# Patient Record
Sex: Female | Born: 1970
Health system: Southern US, Community
[De-identification: ages and names within clinical notes are randomized; demographics above are authoritative.]

## PROBLEM LIST (undated history)

## (undated) DIAGNOSIS — G43909 Migraine, unspecified, not intractable, without status migrainosus: Secondary | ICD-10-CM

## (undated) DIAGNOSIS — F329 Major depressive disorder, single episode, unspecified: Secondary | ICD-10-CM

## (undated) DIAGNOSIS — M1990 Unspecified osteoarthritis, unspecified site: Secondary | ICD-10-CM

## (undated) DIAGNOSIS — N209 Urinary calculus, unspecified: Secondary | ICD-10-CM

## (undated) DIAGNOSIS — Z90710 Acquired absence of both cervix and uterus: Secondary | ICD-10-CM

## (undated) DIAGNOSIS — N189 Chronic kidney disease, unspecified: Secondary | ICD-10-CM

## (undated) DIAGNOSIS — K589 Irritable bowel syndrome without diarrhea: Secondary | ICD-10-CM

## (undated) DIAGNOSIS — M519 Unspecified thoracic, thoracolumbar and lumbosacral intervertebral disc disorder: Secondary | ICD-10-CM

## (undated) DIAGNOSIS — G4730 Sleep apnea, unspecified: Secondary | ICD-10-CM

## (undated) DIAGNOSIS — D126 Benign neoplasm of colon, unspecified: Secondary | ICD-10-CM

## (undated) DIAGNOSIS — F32A Depression, unspecified: Secondary | ICD-10-CM

## (undated) DIAGNOSIS — G4733 Obstructive sleep apnea (adult) (pediatric): Secondary | ICD-10-CM

## (undated) DIAGNOSIS — K5790 Diverticulosis of intestine, part unspecified, without perforation or abscess without bleeding: Secondary | ICD-10-CM

## (undated) DIAGNOSIS — E079 Disorder of thyroid, unspecified: Secondary | ICD-10-CM

## (undated) DIAGNOSIS — I10 Essential (primary) hypertension: Secondary | ICD-10-CM

## (undated) DIAGNOSIS — F419 Anxiety disorder, unspecified: Secondary | ICD-10-CM

## (undated) HISTORY — DX: Benign neoplasm of colon, unspecified: D12.6

## (undated) HISTORY — PX: PARTIAL HYSTERECTOMY: SHX80

## (undated) HISTORY — DX: Sleep apnea, unspecified: G47.30

## (undated) HISTORY — DX: Unspecified osteoarthritis, unspecified site: M19.90

## (undated) HISTORY — PX: BACK SURGERY: SHX140

## (undated) HISTORY — PX: POLYPECTOMY: SHX149

## (undated) HISTORY — DX: Depression, unspecified: F32.A

## (undated) HISTORY — DX: Essential (primary) hypertension: I10

## (undated) HISTORY — DX: Disorder of thyroid, unspecified: E07.9

## (undated) HISTORY — DX: Diverticulosis of intestine, part unspecified, without perforation or abscess without bleeding: K57.90

## (undated) HISTORY — PX: LITHOTRIPSY: SUR834

## (undated) HISTORY — DX: Acquired absence of both cervix and uterus: Z90.710

## (undated) HISTORY — PX: COLONOSCOPY: SHX174

## (undated) HISTORY — DX: Urinary calculus, unspecified: N20.9

## (undated) HISTORY — DX: Unspecified thoracic, thoracolumbar and lumbosacral intervertebral disc disorder: M51.9

## (undated) HISTORY — DX: Chronic kidney disease, unspecified: N18.9

## (undated) HISTORY — DX: Anxiety disorder, unspecified: F41.9

## (undated) HISTORY — DX: Obstructive sleep apnea (adult) (pediatric): G47.33

---
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
## 1999-05-15 ENCOUNTER — Ambulatory Visit: Admission: RE | Admit: 1999-05-15 | Discharge: 1999-05-15 | Payer: Self-pay | Admitting: Family Medicine

## 2000-07-22 ENCOUNTER — Inpatient Hospital Stay (HOSPITAL_COMMUNITY): Admission: EM | Admit: 2000-07-22 | Discharge: 2000-07-26 | Payer: Self-pay | Admitting: Psychiatry

## 2001-09-19 ENCOUNTER — Other Ambulatory Visit: Admission: RE | Admit: 2001-09-19 | Discharge: 2001-09-19 | Payer: Self-pay | Admitting: Family Medicine

## 2003-05-19 ENCOUNTER — Encounter: Admission: RE | Admit: 2003-05-19 | Discharge: 2003-05-19 | Payer: Self-pay | Admitting: Family Medicine

## 2003-05-19 ENCOUNTER — Encounter: Payer: Self-pay | Admitting: Family Medicine

## 2003-12-30 ENCOUNTER — Ambulatory Visit (HOSPITAL_COMMUNITY): Admission: RE | Admit: 2003-12-30 | Discharge: 2003-12-30 | Payer: Self-pay | Admitting: Urology

## 2005-07-27 ENCOUNTER — Ambulatory Visit: Payer: Self-pay | Admitting: Internal Medicine

## 2006-06-04 ENCOUNTER — Encounter: Admission: RE | Admit: 2006-06-04 | Discharge: 2006-06-04 | Payer: Self-pay | Admitting: Family Medicine

## 2006-06-28 ENCOUNTER — Ambulatory Visit: Payer: Self-pay

## 2006-06-28 ENCOUNTER — Ambulatory Visit: Payer: Self-pay | Admitting: Family Medicine

## 2006-07-08 ENCOUNTER — Ambulatory Visit: Payer: Self-pay | Admitting: Family Medicine

## 2006-07-11 ENCOUNTER — Inpatient Hospital Stay (HOSPITAL_COMMUNITY): Admission: RE | Admit: 2006-07-11 | Discharge: 2006-07-13 | Payer: Self-pay | Admitting: Obstetrics & Gynecology

## 2006-07-11 ENCOUNTER — Encounter (INDEPENDENT_AMBULATORY_CARE_PROVIDER_SITE_OTHER): Payer: Self-pay | Admitting: *Deleted

## 2006-09-06 ENCOUNTER — Ambulatory Visit: Payer: Self-pay | Admitting: Family Medicine

## 2006-09-26 ENCOUNTER — Ambulatory Visit: Payer: Self-pay | Admitting: Family Medicine

## 2006-11-13 ENCOUNTER — Ambulatory Visit: Payer: Self-pay | Admitting: Family Medicine

## 2006-12-04 ENCOUNTER — Ambulatory Visit: Payer: Self-pay | Admitting: Family Medicine

## 2007-01-01 ENCOUNTER — Ambulatory Visit: Payer: Self-pay | Admitting: Family Medicine

## 2007-01-01 LAB — CONVERTED CEMR LAB
CO2: 25 meq/L (ref 19–32)
Calcium: 9 mg/dL (ref 8.4–10.5)
Chloride: 104 meq/L (ref 96–112)
Chol/HDL Ratio, serum: 2.8
Cholesterol: 149 mg/dL (ref 0–200)
Creatinine, Ser: 0.7 mg/dL (ref 0.4–1.2)
Glomerular Filtration Rate, Af Am: 122 mL/min/{1.73_m2}
Glucose, Bld: 80 mg/dL (ref 70–99)

## 2007-01-07 DIAGNOSIS — I10 Essential (primary) hypertension: Secondary | ICD-10-CM | POA: Insufficient documentation

## 2007-02-20 ENCOUNTER — Ambulatory Visit: Payer: Self-pay | Admitting: Family Medicine

## 2007-06-19 ENCOUNTER — Ambulatory Visit: Payer: Self-pay | Admitting: Family Medicine

## 2007-06-19 DIAGNOSIS — G4700 Insomnia, unspecified: Secondary | ICD-10-CM

## 2007-07-17 ENCOUNTER — Ambulatory Visit: Payer: Self-pay | Admitting: Family Medicine

## 2007-07-17 LAB — CONVERTED CEMR LAB: Beta hcg, urine, semiquantitative: NEGATIVE

## 2007-11-03 ENCOUNTER — Ambulatory Visit: Payer: Self-pay | Admitting: Family Medicine

## 2007-11-14 ENCOUNTER — Telehealth (INDEPENDENT_AMBULATORY_CARE_PROVIDER_SITE_OTHER): Payer: Self-pay | Admitting: Family Medicine

## 2007-12-30 ENCOUNTER — Telehealth (INDEPENDENT_AMBULATORY_CARE_PROVIDER_SITE_OTHER): Payer: Self-pay | Admitting: *Deleted

## 2007-12-31 ENCOUNTER — Ambulatory Visit: Payer: Self-pay | Admitting: Family Medicine

## 2008-01-26 ENCOUNTER — Telehealth (INDEPENDENT_AMBULATORY_CARE_PROVIDER_SITE_OTHER): Payer: Self-pay | Admitting: *Deleted

## 2008-01-26 ENCOUNTER — Ambulatory Visit: Payer: Self-pay | Admitting: Family Medicine

## 2008-03-30 ENCOUNTER — Ambulatory Visit: Payer: Self-pay | Admitting: Internal Medicine

## 2008-03-30 DIAGNOSIS — J069 Acute upper respiratory infection, unspecified: Secondary | ICD-10-CM | POA: Insufficient documentation

## 2008-05-26 ENCOUNTER — Encounter: Admission: RE | Admit: 2008-05-26 | Discharge: 2008-07-13 | Payer: Self-pay | Admitting: Obstetrics & Gynecology

## 2008-06-30 ENCOUNTER — Inpatient Hospital Stay (HOSPITAL_COMMUNITY): Admission: AD | Admit: 2008-06-30 | Discharge: 2008-06-30 | Payer: Self-pay | Admitting: Obstetrics and Gynecology

## 2008-07-06 ENCOUNTER — Inpatient Hospital Stay (HOSPITAL_COMMUNITY): Admission: AD | Admit: 2008-07-06 | Discharge: 2008-07-06 | Payer: Self-pay | Admitting: Obstetrics and Gynecology

## 2008-08-25 ENCOUNTER — Inpatient Hospital Stay (HOSPITAL_COMMUNITY): Admission: RE | Admit: 2008-08-25 | Discharge: 2008-08-28 | Payer: Self-pay | Admitting: Obstetrics & Gynecology

## 2008-09-29 ENCOUNTER — Ambulatory Visit: Payer: Self-pay | Admitting: Internal Medicine

## 2008-09-29 DIAGNOSIS — M5416 Radiculopathy, lumbar region: Secondary | ICD-10-CM

## 2008-09-29 DIAGNOSIS — M5430 Sciatica, unspecified side: Secondary | ICD-10-CM | POA: Insufficient documentation

## 2008-09-30 ENCOUNTER — Encounter: Payer: Self-pay | Admitting: Internal Medicine

## 2008-10-05 ENCOUNTER — Telehealth: Payer: Self-pay | Admitting: Internal Medicine

## 2008-10-10 ENCOUNTER — Encounter: Admission: RE | Admit: 2008-10-10 | Discharge: 2008-10-10 | Payer: Self-pay | Admitting: Internal Medicine

## 2008-10-11 ENCOUNTER — Encounter: Payer: Self-pay | Admitting: Internal Medicine

## 2008-10-13 ENCOUNTER — Telehealth: Payer: Self-pay | Admitting: Internal Medicine

## 2008-10-18 LAB — CONVERTED CEMR LAB: Pap Smear: NORMAL

## 2008-10-19 ENCOUNTER — Encounter: Payer: Self-pay | Admitting: Internal Medicine

## 2008-10-25 ENCOUNTER — Telehealth: Payer: Self-pay | Admitting: Internal Medicine

## 2008-11-23 ENCOUNTER — Ambulatory Visit (HOSPITAL_COMMUNITY): Admission: RE | Admit: 2008-11-23 | Discharge: 2008-11-23 | Payer: Self-pay | Admitting: Neurosurgery

## 2008-11-23 HISTORY — PX: LUMBAR DISC SURGERY: SHX700

## 2009-02-11 ENCOUNTER — Ambulatory Visit: Payer: Self-pay | Admitting: Internal Medicine

## 2009-02-11 DIAGNOSIS — J019 Acute sinusitis, unspecified: Secondary | ICD-10-CM

## 2009-02-11 DIAGNOSIS — G4733 Obstructive sleep apnea (adult) (pediatric): Secondary | ICD-10-CM

## 2009-02-26 ENCOUNTER — Encounter (INDEPENDENT_AMBULATORY_CARE_PROVIDER_SITE_OTHER): Payer: Self-pay | Admitting: Obstetrics & Gynecology

## 2009-02-26 ENCOUNTER — Ambulatory Visit (HOSPITAL_COMMUNITY): Admission: RE | Admit: 2009-02-26 | Discharge: 2009-02-26 | Payer: Self-pay | Admitting: Obstetrics & Gynecology

## 2009-03-16 ENCOUNTER — Ambulatory Visit: Payer: Self-pay | Admitting: Internal Medicine

## 2009-03-16 DIAGNOSIS — R635 Abnormal weight gain: Secondary | ICD-10-CM | POA: Insufficient documentation

## 2009-03-16 LAB — CONVERTED CEMR LAB
ALT: 24 units/L (ref 0–35)
Albumin: 3.8 g/dL (ref 3.5–5.2)
Alkaline Phosphatase: 54 units/L (ref 39–117)
Basophils Relative: 0.6 % (ref 0.0–3.0)
Bilirubin Urine: NEGATIVE
Bilirubin, Direct: 0.1 mg/dL (ref 0.0–0.3)
CO2: 27 meq/L (ref 19–32)
Chloride: 106 meq/L (ref 96–112)
Eosinophils Absolute: 0.1 10*3/uL (ref 0.0–0.7)
Eosinophils Relative: 1.6 % (ref 0.0–5.0)
Hemoglobin: 12.5 g/dL (ref 12.0–15.0)
Hgb A1c MFr Bld: 4.8 % (ref 4.6–6.5)
Leukocytes, UA: NEGATIVE
MCHC: 34.7 g/dL (ref 30.0–36.0)
MCV: 91.5 fL (ref 78.0–100.0)
Monocytes Absolute: 0.5 10*3/uL (ref 0.1–1.0)
Neutro Abs: 3.5 10*3/uL (ref 1.4–7.7)
Nitrite: NEGATIVE
Potassium: 3.7 meq/L (ref 3.5–5.1)
RBC: 3.94 M/uL (ref 3.87–5.11)
Sodium: 139 meq/L (ref 135–145)
Specific Gravity, Urine: >=1.03 (ref 1.000–1.030)
T3, Free: 3 pg/mL (ref 2.3–4.2)
T4, Total: 9 ug/dL (ref 5.0–12.5)
Total Protein: 6.7 g/dL (ref 6.0–8.3)
WBC: 5.6 10*3/uL (ref 4.5–10.5)
pH: 6 (ref 5.0–8.0)

## 2009-03-17 ENCOUNTER — Encounter: Payer: Self-pay | Admitting: Internal Medicine

## 2009-04-27 ENCOUNTER — Encounter: Admission: RE | Admit: 2009-04-27 | Discharge: 2009-07-05 | Payer: Self-pay | Admitting: Neurosurgery

## 2011-03-09 ENCOUNTER — Institutional Professional Consult (permissible substitution): Payer: Self-pay | Admitting: Internal Medicine

## 2011-04-24 ENCOUNTER — Encounter: Payer: Self-pay | Admitting: Internal Medicine

## 2011-04-26 ENCOUNTER — Institutional Professional Consult (permissible substitution): Payer: Self-pay | Admitting: Internal Medicine

## 2011-05-08 NOTE — Op Note (Signed)
NAMELEYLANY, NORED             ACCOUNT NO.:  [id number]   MEDICAL RECORD NO.:  [id number]          PATIENT TYPE:  AMB   LOCATION:  SDC                           FACILITY:  [facility]   PHYSICIAN:  Freddy Finner, M.D.   DATE OF BIRTH:  Sep 23, 1971   DATE OF PROCEDURE:  02/26/2009  DATE OF DISCHARGE:                               OPERATIVE REPORT   PREOPERATIVE DIAGNOSES:  1. Dysfunctional uterine bleeding.  2. Ultrasound finding of a large endometrial polyp.   POSTOPERATIVE DIAGNOSES:  1. Dysfunctional uterine bleeding.  2. Ultrasound finding of a large endometrial polyp.   OPERATIVE PROCEDURES:  1. Dilation and curettage.  2. Hysteroscopy with resection of polyp.   SURGEON:  Freddy Finner, MD   ANESTHESIA:  General.   INTRAOPERATIVE COMPLICATIONS:  None.   ESTIMATED INTRAOPERATIVE BLOOD LOSS:  50 mL.   SORBITOL DEFICIT:  65 mL.   The patient is a 40 year old who has had a prolonged period going on for  approximately 2 weeks and in the very recent past has had marked  increase in flow with large clots and using several tampons daily.  She  had an ultrasound in the office on the day before surgery showing what  appeared to be a 3 cm endometrial polyp.  She was admitted at this time  for a dilation and curettage hysteroscopy.  She was admitted on the  morning of surgery.  She was given a bolus of Ancef IV.  She was taken  to the operating room, placed under adequate general anesthesia, placed  in the dorsal lithotomy position.  Allen stirrups were used.  Betadine  prep was carried out in the usual fashion.  Sterile drapes were applied.  A bivalve speculum was introduced.  Cervix was visualized, grasped with  a single-tooth tenaculum.  Cervix easily dilated to 27 with Pratts.  A  12.5 degree hysteroscope was introduced.  Photographs revealed what  appeared to be at least 2 polyps, although the large 3 cm polyp was not  felt to be that large.  Thorough curettage was carried out  with a sharp  curet and exploration with polyp forceps.  Re-inspection revealed  adequate sampling of the entire endometrium and resection of the polyp.  Procedure was terminated, the instruments were  removed.  The patient was awakened, taken to recovery in good condition.  She will be given Vicodin to be taken as needed for postoperative pain.  She was given Toradol 30 mg IV.  Before leaving the operating room, she  will be given routine outpatient surgical instructions.  She is for  follow-up in 1 week.      Freddy Finner, M.D.  Electronically Signed     WRN/MEDQ  D:  02/27/2009  T:  02/27/2009  Job:  [id number]

## 2011-05-08 NOTE — Op Note (Signed)
NAMEDANN, Monique Gross             ACCOUNT NO.:  [id number]   MEDICAL RECORD NO.:  [id number]          PATIENT TYPE:  OIB   LOCATION:  3524                         FACILITY:  [facility]   PHYSICIAN:  Danae Orleans. Venetia Maxon, M.D.  DATE OF BIRTH:  [birth year]   DATE OF PROCEDURE:  11/23/2008  DATE OF DISCHARGE:  11/23/2008                               OPERATIVE REPORT   PREOPERATIVE DIAGNOSES:  Left L5-S1 herniated lumbar disk with  spondylosis, degenerative disk disease, and radiculopathy.   POSTOPERATIVE DIAGNOSES:  Left L5-S1 herniated lumbar disk with  spondylosis, degenerative disk disease, and radiculopathy.   PROCEDURE:  Left L5-S1 microdiskectomy with microdissection.   SURGEON:  Danae Orleans. Venetia Maxon, MD   ASSISTANT:  Hewitt Shorts, MD   ANESTHESIA:  General endotracheal anesthesia.   ESTIMATED BLOOD LOSS:  Minimal (less than 25 mL).   COMPLICATIONS:  None.   DISPOSITION:  Recovery.   INDICATIONS:  Monique Gross is 40 year old morbidly obese postpartum  woman with a severe left lumbar radiculopathy.  She had an MRI, which  shows a large fragment of herniated disk material causing significant  nerve root compression.  It was elected to take her for surgery for  microdiskectomy.   PROCEDURE:  Monique Gross was brought to the operating room.  Following  satisfactory and uncomplicated induction of general endotracheal  anesthesia and placement of intravenous lines, the patient was placed in  a prone position on the Wilson frame.  Her low back was then prepped and  draped in the usual sterile fashion.  A spinal needle was used to  localize the planned skin incision and demonstrated over the superior  aspect of the sacrum.  Incision was made after infiltrating skin and  subcutaneous tissues with local lidocaine and carried to the lumbodorsal  fascia, which was incised on the left side of midline using 10 blade.  The patient had approximately 6 inches of adipose tissue above the  lumbodorsal fascia.  The subperiosteal dissection was then performed  exposing the L5-S1 interspace and intraoperative x-ray confirmed correct  level.  Hemisemilaminectomy of L5 was performed and foraminotomy was  performed overlying the superior aspect of the sacrum.  Microscope was  brought into the field and after mobilizing and removing the ligamentum  flavum, the herniated disk material was identified and this was found to  be a disk herniation within the axilla of the S1 nerve root and the S1  nerve root was pushed laterally and with under considerable pressure.  The herniated initial removal of disk material was performed from within  the axilla of the nerve root and subsequently this allowed [place] to  mobilize the S1 nerve root medially.  The disk space was then further  evacuated of residual disk material both laterally and medially as well  as cephalad herniated disk material was removed and caudally migrated  fragments of disk material.  At minimal curettage, the endplates were  performed to remove loose remaining disk material.  At this point, it  was felt that the nerve roots were well decompressed and the S1 nerve  root was now mobilized and it  did not appear to be under any pressure.  Wound was irrigated.  Hemostasis was assured.  The operative site was  bathed in Depo-Medrol and fentanyl and the lumbodorsal fascia was closed  with 0 Vicryl sutures.  Subcutaneous tissues were approximated with 2-0  Vicryl interrupted inverted sutures and skin edges were approximated  with interrupted 3-0 Vicryl subcuticular  stitch.  Wound was dressed  with Dermabond.  The patient was x-rayed in the operating room, taken to  the recovery in stable and satisfactory condition.  She tolerated the  operation well.  Counts were correct at the end of the case.      Danae Orleans. Venetia Maxon, M.D.  Electronically Signed     JDS/MEDQ  D:  11/23/2008  T:  11/23/2008  Job:  [id number]

## 2011-05-08 NOTE — Discharge Summary (Signed)
Monique Gross, Monique Gross             ACCOUNT NO.:  [id number]   MEDICAL RECORD NO.:  [id number]          PATIENT TYPE:  INP   LOCATION:  9312                          FACILITY:  [facility]   PHYSICIAN:  Guy Sandifer. Henderson Cloud, M.D. DATE OF BIRTH:  [birth year]   DATE OF ADMISSION:  08/25/2008  DATE OF DISCHARGE:  08/28/2008                               DISCHARGE SUMMARY   ADMITTING DIAGNOSES:  1. Intrauterine pregnancy at 38-6/7th weeks.  2. Surgically scarred uterus.   DISCHARGE DIAGNOSES:  1. Intrauterine pregnancy at 38-6/7th weeks.  2. Surgically scarred uterus.   PROCEDURE:  On August 25, 2008, low transverse cesarean section.   REASON FOR ADMISSION:  This patient is a 40 year old married white  female G3, P0 with a previous myomectomy.  She is admitted for primary  cesarean section.   HOSPITAL COURSE:  The patient is admitted to the hospital and undergoes  the above procedure.  It is productive of a viable baby, Apgars of 9 at  9 minutes and 1 at 5 minutes respectively.  Birth weight, 10 pounds 8  ounces.  Arterial cord pH 7.31.  On the first postoperative day, vital  signs are stable.  She is afebrile.  Hemoglobin is 9.9 and platelets are  132,000.  Some blood pressures are labile as high as 161/82, however of  the last 24 hours prior to admission, blood pressures are 137-140/87-90.  Repeat laboratories on the day of discharge reveal normal values for  pregnancy with platelets of 157,000.  She does have a lower extremity  edema.  Incision is healing well.   CONDITION ON DISCHARGE:  Good.   DIET:  Regular as tolerated.   ACTIVITY:  No lifting, no operation of automobiles, and no vaginal  entry.  She is to call the office for problems including but not limited  to temperature of 101 degrees, persistent nausea or vomiting, increasing  pain, or heavy vaginal bleeding.   MEDICATIONS:  1. Percocet 5/325 mg, #40 1-2 p.o. q.6 h. p.r.n.  2. Ibuprofen 600 mg q.6 h. p.r.n.  3. Prenatal  vitamin daily.  4. Follow up is in the office in 4-5 days for staple removal and blood      pressure check.      Guy Sandifer Henderson Cloud, M.D.  Electronically Signed     JET/MEDQ  D:  08/28/2008  T:  08/28/2008  Job:  [id number]

## 2011-05-08 NOTE — Op Note (Signed)
NAMESHAWNIE, NICOLE NO.:  [id number]   MEDICAL RECORD NO.:  [id number]          PATIENT TYPE:  INP   LOCATION:  9121                          FACILITY:  [facility]   PHYSICIAN:  Freddy Finner, M.D.   DATE OF BIRTH:  1971-02-18   DATE OF PROCEDURE:  08/25/2008  DATE OF DISCHARGE:                               OPERATIVE REPORT   PREOPERATIVE DIAGNOSES:  1. Intrauterine pregnancy at term.  2. Surgically scarred uterus by previous deep myomectomy.   POSTOPERATIVE DIAGNOSES:  1. Intrauterine pregnancy at term.  2. Surgically scarred uterus by previous deep myomectomy.   OPERATIVE PROCEDURE:  Primary low transverse cervical cesarean section  with delivery of viable female infant, Apgars of 9 and 9, arterial cord  pH 7.31, birth weight 10 pounds 8 ounces.   INTRAOPERATIVE COMPLICATIONS:  None.   ANESTHESIA:  Spinal.   ESTIMATED INTRAOPERATIVE BLOOD LOSS:  800 mL.   SURGEON:  Freddy Finner, M.D.   ASSISTANT:  Stann Mainland. Vincente Poli, M.D.   The patient was admitted on the morning of surgery.  She was brought to  the operating room.  She received a gram of Ancef IV.  She was placed  under adequate spinal anesthesia, placed in dorsal recumbent position.  Abdomen was prepped and draped in the usual fashion.  The abdominal  panniculus was taped to EVOscreen for exposure during the procedure.  Foley catheter was placed using sterile technique.  A lower abdominal  transverse incision was made approximately 2 cm above the symphysis and  carried sharply down to fascia.  Fascia was entered sharply and extended  to the external skin incision.  Rectus sheath was developed superiorly  and inferiorly with blunt and sharp dissection.  Rectus muscles were  divide in the midline.  Peritoneum was entered sharply and extended  bluntly to the external skin incision.  Bladder blade was placed.  Transverse incision was made in the visceral peritoneum overlying the  lower uterine segment.   Bladder was dissected off the lower segment.  Transverse incision was made in the lower uterine segment with a scalpel  and extended bluntly.  Amniotomy was performed.  Fluid was clear.  A  vacuum extractor was then used to deliver a viable female infant without  any significant complications, statistics are noted above.  Cord blood  was obtained for an arterial sample.  The placenta was submitted for  cord blood donation.  The uterine cavity was confirmed to be clear by  careful manual exploration and removal of all parts of conception.  Uterus was delivered onto the anterior abdominal wall.  Tubes and  ovaries were normal.  There were no adhesions but there was a defect in  the superior fundus in the midline consistent with a previous incision.  Uterine incision was closed in a double layer, running locking 0  Monocryl was used for the first and an imbricating suture of 0 Monocryl  for the second.  A figure-of-eight was required on the right side of the  incision for complete hemostasis.  Uterus was delivered back into the  abdominal cavity.  Irrigation  was carried out.  Hemostasis was complete.  All pack, needle and instrument counts were correct.  Abdominal incision  was then closed in layers.  Running 0 Monocryl was used to close  peritoneum and reapproximate the rectus muscles.  Fascia was closed with  a looped 0 PDS running from angle to angle on either side.  Subcutaneous  tissue was approximated with 2-0 plain.  Skin was closed with wide skin  staples.  The patient tolerated the operative procedure well.  She was  taken to the recovery room in good condition.      Freddy Finner, M.D.  Electronically Signed     WRN/MEDQ  D:  08/25/2008  T:  08/25/2008  Job:  [id number]

## 2011-05-08 NOTE — H&P (Signed)
NAMECHLOEANNE, POTEET NO.:  [id number]   MEDICAL RECORD NO.:  [id number]          PATIENT TYPE:  INP   LOCATION:                                F[facility]   PHYSICIAN:  Freddy Finner, M.D.   DATE OF BIRTH:  [birth year]   DATE OF ADMISSION:  08/25/2008  DATE OF DISCHARGE:                              HISTORY & PHYSICAL   ADMISSION DIAGNOSIS:  Intrauterine pregnancy at 38-6/[redacted] weeks gestation.  Surgically scarred uterus from previous date myomectomy for cesarean  delivery.   The patient is a 40 year old white married female, gravida 3 para 0, who  had a previous myomectomy for a large submucous myoma requiring deep  incision through the fundus.  She is now admitted for cesarean delivery.  Her prenatal course has been remarkable for mild light increase in blood  pressure with a probable mild chronic hypertensive component.  She was  hospitalized during the pregnancy once for suspected kidney stones,  which were not documented, but an incidental gallstone was noted at that  time measuring approximately 2.2 cm.  Her strep status is known to be  negative.  Her pregnancy has otherwise been further remarkable for lower  back pain requiring physical therapy and rehab.  There have been no  other prenatal complications.   CURRENT REVIEW OF SYSTEMS:  No cardiac, pulmonary or GI symptoms or GU  symptoms.   PAST MEDICAL HISTORY:  Detailed in the prenatal summary and will not be  repeated.   PHYSICAL EXAMINATION:  HEENT:  Grossly within normal limits.  The  patient is alert and oriented and cooperative.  NECK:  Her thyroid gland is not palpably enlarged to my examination.  VITAL SIGNS:  Blood pressure in the office on the day before admission  was 144/92 with a trace of proteinuria.  CHEST:  Clear to auscultation throughout.  HEART:  Normal sinus rhythm without murmurs, rubs or gallops.  ABDOMEN:  Gravid.  Fundal height is 42 cm.  Estimated fetal weight is 9   pounds.  EXTREMITIES:  There is +2 edema.  Deep tendon reflexes 1+.   ASSESSMENT:  Intrauterine pregnancy at 38-6/[redacted] weeks gestation.  Deeply  scarred uterine fundus.   PLAN:  Cesarean delivery.      Freddy Finner, M.D.  Electronically Signed     WRN/MEDQ  D:  08/24/2008  T:  08/24/2008  Job:  [id number]
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
## 2011-05-11 NOTE — Discharge Summary (Signed)
NAMEMarland Kitchen  Monique Gross, Monique Gross            ACCOUNT NO.:  [id number]   MEDICAL RECORD NO.:  [id number]          PATIENT TYPE:  INP   LOCATION:  9308                          FACILITY:  [facility]   PHYSICIAN:  Freddy Finner, M.D.   DATE OF BIRTH:  05-Sep-1971   DATE OF ADMISSION:  07/11/2006  DATE OF DISCHARGE:  07/13/2006                                 DISCHARGE SUMMARY   DISCHARGE DIAGNOSIS:  Submucous myoma.   SECONDARY DIAGNOSIS:  Hypertension.   PROCEDURE:  Hysteroscopy followed laparotomy with myomectomy.   INTRAOPERATIVE AND POSTOPERATIVE COMPLICATIONS:  None.   DISPOSITION:  The patient is in satisfactory, improved condition at the time  of her discharge.  She is to resume all of her preoperative medications  including:   1.  Two medications for hypertension, Benicar and hydrochlorothiazide.  2.  She is to use MiraLax as per her custom.  3.  She is to use Colace as needed.  4.  She is given Percocet 5/325 mg to be taken one to two every 4-6 hours as      needed for postoperative pain.  5.  She is to add over-the-counter ibuprofen to this regimen as needed.  6.  She is given Zofran oral dissolving tablets to be used as needed for      nausea.   Details of the present illness, past history, family history, review of  systems and physical exam recorded in the admission note.  Her physical exam  was primarily remarkable for enlargement of the uterus and for  sonohysterogram findings of a very large submucous myoma.   Laboratory data during this admission includes a normal prothrombin time and  PTT on admission.  Hemoglobin of 10.5 on admission.  Essentially normal Chem-  7 on admission.  Postoperative hemoglobin was 9.2.  pathologic examination  of the myoma is pending at the time of discharge.   The patient was admitted on the morning of surgery.  Hysteroscopy was  performed with __________ being able to accomplish a submucous myomectomy  hysteroscopically.  Visibility was  significantly compromised because of the  presence of bleeding and large size of the mass.  For that reason we elected  to proceed with laparotomy and open myomectomy.  This was accomplished  without difficulty.  The patient's postoperative course was without  complication.  She remained afebrile throughout her postoperative stay.  By  the morning of the second postoperative day she was ambulating without  difficulty.  She was having adequate bowel and bladder function.  She was  tolerating a regular diet.  Further disposition as noted above.      Freddy Finner, M.D.  Electronically Signed     WRN/MEDQ  D:  07/13/2006  T:  07/13/2006  Job:  [id number]

## 2011-05-11 NOTE — Op Note (Signed)
NAMEMarland Gross  TAHARA, RUFFINI            ACCOUNT NO.:  [id number]   MEDICAL RECORD NO.:  [id number]          PATIENT TYPE:  INP   LOCATION:  9308                          FACILITY:  [facility]   PHYSICIAN:  Freddy Finner, M.D.   DATE OF BIRTH:  20-Jan-1971   DATE OF PROCEDURE:  DATE OF DISCHARGE:                                 OPERATIVE REPORT   PREOPERATIVE DIAGNOSIS:  Submucous myoma.   OPERATIVE PROCEDURE:  Laparoscopy followed by laparotomy and open  myomectomy.   SURGEON:  Neal.   ASSISTANT:  __________   ESTIMATED OPERATIVE BLOOD LOSS:  100 cc.   SORBITOL DEFICIT FROM HYSTEROSCOPY:  100 CC.   ANESTHESIA:  General endotracheal.   INTRAOPERATIVE COMPLICATIONS:  None.   The patient's problems date back to the Fall of 2006. She had hysteroscopy  and laparoscopy at another facility with another physician.  There were  benign endometrial samples taken at that hysteroscopy but the submucous  myoma was not fully outlined or appreciated.  Apparently there were no  significant abnormal pelvic findings except for some small paravertebral  cysts that were fulgurated.  She presented to my office in June of this year  and subsequently had a sonohistogram which identified a very large submucous  myoma measuring at least 6.5 cm and in one projection 7.8 x 6.5 cm.  It  appeared to be a degenerating myoma and had some calcifications in the mass  itself.  The patient has continued to have major clinical symptoms,  menorrhagia with large clots and dysmenorrhea.  She specifically does not  want to have a hysterectomy.  She is now admitted for attempted  hysteroscopic myomectomy with the distinct understanding that it is very  likely that a laparotomy will be required.  She is brought to the operating  room under those circumstances.  She was given a bolus of cefoxitin  preoperatively.   After arriving in the operating room, she was placed under adequate general  endotracheal anesthesia, placed in  dorsolithotomy position.  Betadine prep  of perineum and vagina was carried out in usual fashion.  Sterile drapes  were applied.  The cervix was visualized  using a bivalve speculum and was  grasped with the anterior lip of single tooth tenaculum.  A paracervical  block was placed with injections at 4:00 and 9:00 in the vaginal fornices, a  total of 10 cc of 1% plain Xylocaine was utilized.  The uterus sounded to 12  cm.  The cervix was dilated to 23 with [facility] dilators.  The __________  hysteroscope was introduced using 3% Sorbitol as a distending medium.  Repeated attempts were made to clear the blood from the cavity including  polyps which were retrieved with a right-angle staying forceps.  It became  obvious that it would not be possible to see around a major portion of the  myoma.  For that reason, it was elected to proceed with a laparotomy.  The  drapes were removed, the abdomen was prepped and draped in the usual  fashion.  A Foley catheter was placed.  A low abdominal transverse incision  was  made and carried sharply down the fascia which was entered sharply and  then extended to extend the skin incision.  __________  superiorly and  inferiorly with blunt and sharp dissection.  The rectus muscle was divided  in the midline.  The peritoneum was entered and extended to extend the skin  incision.  With exploration of the upper abdomen was somewhat compromised by  the patient's body habitus with obesity, but the liver edge was palpably  normal.  No obvious palpable abnormalities were noted.  An Lenox Ahr self-retaining retractors placed.  Moist packs were used to pack  the intestinal contents out of the pelvis.  Zero-Monocryl suture was placed  in the superior fundus to elevate it out of the pelvis and a __________  wet  pack placed behind the uterus to keep it in the operative field for the  ensuing procedure.   An  incision was made in the midline across the top of the  fundus and onto  the posterior fundal surface where with careful sharp dissection a fibroid  was identified.  Using Mayo scissors and towel clips, the fibroid was  eventually completely cleared and separated and sent for pathologic  examination.  Manual exploration revealed a normal palpable endometrial  cavity below the level of the fibroid.  The muscle incision was then closed  in double layers with interrupted figure-of-eights with 0 Monocryl.  The  surface incision was closed with a running and locking 0 Monocryl.  Tubes  and ovaries were inspected and found to be normal.  All pack, needle and  instrument counts were made after irrigation was carried out.  Hemostasis  was adequate.  Intercede was placed over the incision lying in the upper  fundus.  After appropriate counts, the abdominal incision was closed in  layers.  The rectus muscle was reapproximated with a figure-of-eight of 0  Monocryl.  The fascia was closed with a running 0 PDS __________  .  Subcutaneous tissue was approximated with running 2-0 plain.  Skin was  closed with a lot of skin staples and __________  Steri-Strips.   ESTIMATED INTRAOPERATIVE BLOOD LOSS:  100 cc as noted above.   The patient was awakened and taken to the recovery room in good condition.      Freddy Finner, M.D.  Electronically Signed     WRN/MEDQ  D:  07/11/2006  T:  07/12/2006  Job:  [id number]

## 2011-05-11 NOTE — Discharge Summary (Signed)
[facility]  Patient:    Monique Gross, Monique Gross                  MRN: [id number] Adm. Date:  [id number] Disc. Date: [id number] Attending:  Gerhard Munch                           Discharge Summary  REASON FOR ADMISSION:  This is a 40 year old divorced white female who was admitted for status post several overdose attempts over the weekend and continued suicidal ideation.  ADMITTING DIAGNOSES: Axis I:    1. Major depressive disorder, recurrent, severe.            2. Alcohol abuse. Axis II:   Deferred. Axis III:  Obesity. Axis IV:   Severe. Axis V:    On admission was 40.  LABORATORY DATA:  Patient had a metabolic panel which showed no abnormalities. Her urine drug screen was positive for opiates which is related to her overdose with some narcotics.  Her urinalysis showed many bacteria but was nitrate negative.  Metabolic panel showed a slightly elevated glucose at 134, albumin low at 3.2, no other abnormalities.  CBC was within normal limits and thyroid functions were within normal limits.  HOSPITAL COURSE:  This patient was placed on Celexa 20 mg and Wellbutrin 150 p.o. q.d. on admission.  She was also started on Klonopin 0.5 mg p.o. b.i.d. p.r.n. anxiety and Trazodone 50 mg q.h.s. for insomnia.  Patient struggled with her sleep throughout her hospitalization, and we eventually gradually increased her Trazodone up to 150 mg p.o. q.h.s. which seems to be helping a little bit.  However, noticeably throughout her hospitalization initially she was quite tearful, withdrawn, sad, but she did attend groups and her affect has become brighter.  She has become more interactive, less tearful.  Also reports decreased thoughts of suicide and increased ability to control her thoughts of suicide.  Patient was dealing with a lot of issues of self esteem and not feeling attractive; however, she did realize that she has more support than she realized.  She had  many friends who came to see her and had made several friends on the unit, and this seemed to increase her self esteem significantly.  At the time of discharge, we increased her Trazodone to 150 mg p.o. q.h.s. and increased her Celexa to 40 mg.  She tolerated the medications without side effects.  Patient was able to contract for safety upon discharge and was willing to follow up with [facility] in the next week to continue outpatient therapy.  Therefore, she was discharged in stable condition.  FINAL DIAGNOSIS: Axis I:    1. Major depressive disorder, recurrent, severe.            2. Alcohol abuse. Axis II:   Deferred. Axis III:  Obesity. Axis IV:   Severe. Axis V:    On admission was 40, on discharge was 65.  DISCHARGE MEDICATIONS: 1. Klonopin 0.5 mg and she was given a scrip for #45, 1-2 p.o. q.d. 2. Celexa 40 mg, #30, one p.o. q.d. 3. Wellbutrin SR 150 mg, one p.o. q.d. 4. Trazodone 150 mg 1 p.o. q.h.s. Each of these were for a one-months supply.  ACTIVITY:  No restrictions.  DIET:  No restrictions.  FOLLOW UP:  With [facility] within the next week as per case manager and myself within the next two to three weeks. DD:  07/26/00 TD:  07/27/00 Job: [id number]

## 2011-05-11 NOTE — H&P (Signed)
NAMEMarland Gross  FLORENCE, YEUNG            ACCOUNT NO.:  [id number]   MEDICAL RECORD NO.:  [id number]          PATIENT TYPE:  INP   LOCATION:  9308                          FACILITY:  [facility]   PHYSICIAN:  Freddy Finner, M.D.   DATE OF BIRTH:  14-Mar-1971   DATE OF ADMISSION:  07/11/2006  DATE OF DISCHARGE:                                HISTORY & PHYSICAL   ADMISSION DIAGNOSIS:  Submucous myoma status post exploratory laparotomy for  resection of submucous myoma.   The patient is a 40 year old white divorced female, gravida 1, para 0, who  has had a significant bleeding issue for at least six to eight months.  Her  operative note contains a brief description of her present history which  will not be repeated here except to say that she had a sonohistogram in the  office which showed a very large submucous myoma.  She is admitted now for a  surgical resection of that lesion via hysteroscopy or laparotomy.  She has  indicated a definite wish to have the lesion removed regardless of the  procedure required.   REVIEW OF SYSTEMS:  Her current review of systems is negative except for GYN  problems, very heavy bleeding with large clots.  Postmenstrual fatigue and  weakness.   PAST MEDICAL HISTORY:  The patient has no known significant illness.  She is  a pack a day cigarette smoker.  She has previously had hysteroscopy and D&C  in the fall of 2006.  She is known to have kidney stones.  She also  apparently has narcolepsy for which she takes Provigil 200 mg a day.  She is  on no other medications on a chronic basis.  She has never had a blood  transfusion.  Does not use alcohol on a regular basis.   FAMILY HISTORY:  Noncontributory.   PHYSICAL EXAMINATION:  HEENT:  Grossly within normal limits.  VITAL SIGNS:  Blood pressure in the office 142/90.  NECK:  Thyroid gland is not palpably enlarged.  CHEST:  Clear to auscultation.  HEART:  Normal sinus rhythm without murmurs, gallops, or rubs.  ABDOMEN:  Soft and nontender without appreciable organomegaly or palpable  masses.  PELVIC:  External genitalia, vagina and cervix are normal to inspection.  Bimanual reveals the uterus to be anterior position at approximately nine  weeks gestational size.  There are no palpable adnexal masses.  RECTAL:  Rectum was normal.  Rectovaginal exam confirms the above findings.   ASSESSMENT:  Uterine leiomyomata, submucous.   PLAN:  Hysteroscopy followed by laparotomy if the mass cannot be resected  hysteroscopically.  The patient is prepared for both events.      Freddy Finner, M.D.  Electronically Signed     WRN/MEDQ  D:  07/11/2006  T:  07/11/2006  Job:  [id number]

## 2011-06-05 ENCOUNTER — Institutional Professional Consult (permissible substitution): Payer: Self-pay | Admitting: Internal Medicine

## 2011-06-24 HISTORY — PX: TUBAL LIGATION: SHX77

## 2011-09-20 LAB — CBC
Platelets: 167
RBC: 4.08
WBC: 11.4 — ABNORMAL HIGH

## 2011-09-20 LAB — URINALYSIS, ROUTINE W REFLEX MICROSCOPIC
Bilirubin Urine: NEGATIVE
Glucose, UA: NEGATIVE
Glucose, UA: NEGATIVE
Ketones, ur: >80 — AB
Nitrite: NEGATIVE
Protein, ur: NEGATIVE
Urobilinogen, UA: 0.2
pH: 6

## 2011-09-20 LAB — URINE MICROSCOPIC-ADD ON

## 2011-09-20 LAB — COMPREHENSIVE METABOLIC PANEL
ALT: 15
AST: 17
Albumin: 2.7 — ABNORMAL LOW
CO2: 22
Calcium: 9
Chloride: 103
GFR calc Af Amer: >60
GFR calc non Af Amer: >60
Sodium: 135

## 2011-09-20 LAB — URINE CULTURE

## 2011-09-25 LAB — CBC
HCT: 41.3 % (ref 36.0–46.0)
Hemoglobin: 13.9 g/dL (ref 12.0–15.0)
MCHC: 33.6 g/dL (ref 30.0–36.0)
MCV: 91 fL (ref 78.0–100.0)
Platelets: 224 10*3/uL (ref 150–400)
RDW: 13 % (ref 11.5–15.5)

## 2011-09-26 LAB — COMPREHENSIVE METABOLIC PANEL
ALT: 19
AST: 21
Albumin: 2 — ABNORMAL LOW
Alkaline Phosphatase: 89
BUN: 11
Chloride: 106
GFR calc Af Amer: >60
Potassium: 3.6
Sodium: 137
Total Bilirubin: 0.4
Total Protein: 4.7 — ABNORMAL LOW

## 2011-09-26 LAB — CBC
HCT: 29 — ABNORMAL LOW
HCT: 29.8 — ABNORMAL LOW
MCHC: 34
MCHC: 34.2
MCV: 93.1
Platelets: 132 — ABNORMAL LOW
Platelets: 157
Platelets: 162
RDW: 14.3
RDW: 14.4
RDW: 14.9
WBC: 4.7
WBC: 7

## 2011-09-26 LAB — URIC ACID: Uric Acid, Serum: 5.7

## 2011-09-26 LAB — RPR: RPR Ser Ql: NONREACTIVE

## 2011-09-28 ENCOUNTER — Ambulatory Visit (INDEPENDENT_AMBULATORY_CARE_PROVIDER_SITE_OTHER): Payer: 59 | Admitting: Family Medicine

## 2011-09-28 ENCOUNTER — Encounter: Payer: Self-pay | Admitting: Family Medicine

## 2011-09-28 DIAGNOSIS — R040 Epistaxis: Secondary | ICD-10-CM | POA: Insufficient documentation

## 2011-09-28 DIAGNOSIS — J329 Chronic sinusitis, unspecified: Secondary | ICD-10-CM

## 2011-09-28 DIAGNOSIS — B001 Herpesviral vesicular dermatitis: Secondary | ICD-10-CM | POA: Insufficient documentation

## 2011-09-28 DIAGNOSIS — B009 Herpesviral infection, unspecified: Secondary | ICD-10-CM

## 2011-09-28 MED ORDER — VALACYCLOVIR HCL 1 G PO TABS: ORAL_TABLET | ORAL | Status: DC

## 2011-09-28 MED ORDER — CLARITHROMYCIN ER 500 MG PO TB24: 1000.0000 mg | ORAL_TABLET | Freq: Every day | ORAL | Status: AC

## 2011-09-28 NOTE — Patient Instructions (Signed)
Schedule a complete physical at your convenience- do not eat before this appt Apply vasoline w/ a Qtip to the inside of your L nostril Avoid using the Flonase temporarily If you have a nose bleed- lean forward, ice to the bridge of the nose, Afrin, nasal packing (tampon).  If still bleeding after 20 minutes, call or go to the [facility] Start Clarithromycin for your sinus infection REST! Hang in there!

## 2011-09-28 NOTE — Assessment & Plan Note (Signed)
Appears to be from shallow ulceration on L anterior septum.  Pt to hold nasal spray.  Reviewed supportive care and red flags that should prompt return.  Pt expressed understanding and is in agreement w/ plan.

## 2011-09-28 NOTE — Assessment & Plan Note (Signed)
infxn has not improved on Zpack.  Switch to biaxin.  Reviewed supportive care and red flags that should prompt return.  Pt expressed understanding and is in agreement w/ plan.

## 2011-09-28 NOTE — Assessment & Plan Note (Signed)
Start Valtrex to improve sxs.

## 2011-09-28 NOTE — Progress Notes (Signed)
  Subjective:    Patient ID: Monique Gross, female    DOB: 1971-08-19, 40 y.o.   MRN: [id number]  HPI New to establish w/ me.  Nose bleeds- reports hx of frequent sinus infxns, went to [facility] Sunday night and started Zpack.  Has had 2 nose bleeds, was passing blood clots (Monday and Thursday).  Has been using Flonase recently due to infxn.  Bleeding lasted few minutes, stopped spontaneously.  No bleeding down back of throat.  Sinus pain is persisting.  Low grade temps.  Cold sore- appeared 1 week ago.  Using Abreva.  Has never used Valtrex.  No longer painful.   Review of Systems For ROS see HPI     Objective:   Physical Exam  Vitals reviewed. Constitutional: She is oriented to person, place, and time. She appears well-developed and well-nourished. No distress.  HENT:  Head: Normocephalic and atraumatic.  Mouth/Throat: Oropharynx is clear and moist. No oropharyngeal exudate.       + TTP over maxillary sinuses L nostril has small ulceration on anterior, medial septum. Large cold sore over R upper lip  Eyes: Conjunctivae and EOM are normal. Pupils are equal, round, and reactive to light.  Neck: Normal range of motion. Neck supple.  Cardiovascular: Normal rate, regular rhythm and normal heart sounds.   Pulmonary/Chest: Effort normal and breath sounds normal. No respiratory distress. She has no wheezes. She has no rales.  Musculoskeletal: She exhibits no edema.  Lymphadenopathy:    She has no cervical adenopathy.  Neurological: She is oriented to person, place, and time. No cranial nerve deficit.  Skin: Skin is warm and dry.  Psychiatric: She has a normal mood and affect. Her behavior is normal.          Assessment & Plan:

## 2011-11-01 ENCOUNTER — Encounter (INDEPENDENT_AMBULATORY_CARE_PROVIDER_SITE_OTHER): Payer: Self-pay | Admitting: General Surgery

## 2011-11-07 ENCOUNTER — Encounter: Payer: Self-pay | Admitting: Family Medicine

## 2011-11-08 ENCOUNTER — Encounter: Payer: 59 | Admitting: Family Medicine

## 2011-11-08 ENCOUNTER — Ambulatory Visit: Payer: Self-pay | Admitting: Family Medicine

## 2011-11-19 ENCOUNTER — Encounter (INDEPENDENT_AMBULATORY_CARE_PROVIDER_SITE_OTHER): Payer: Self-pay

## 2011-11-20 ENCOUNTER — Ambulatory Visit (INDEPENDENT_AMBULATORY_CARE_PROVIDER_SITE_OTHER): Payer: 59 | Admitting: General Surgery

## 2011-11-20 ENCOUNTER — Encounter (INDEPENDENT_AMBULATORY_CARE_PROVIDER_SITE_OTHER): Payer: Self-pay | Admitting: General Surgery

## 2011-11-20 ENCOUNTER — Encounter: Payer: Self-pay | Admitting: Gastroenterology

## 2011-11-20 VITALS — BP 124/78 | HR 64 | Temp 98.4°F | Resp 18 | Ht 66.0 in | Wt 266.6 lb

## 2011-11-20 DIAGNOSIS — K589 Irritable bowel syndrome without diarrhea: Secondary | ICD-10-CM

## 2011-11-20 NOTE — Progress Notes (Signed)
Patient ID: Monique Gross, female   DOB: 1971-02-01, 40 y.o.   MRN: [id number]  Chief Complaint  Patient presents with  . Follow-up    reck gallbladder... last seen 2009    HPI Monique Gross is a 40 y.o. female.   HPI  She was last seen by me October 01, 2008 for asymptomatic cholelithiasis. At that time, she had a 2 cm mobile gallstone.  Over the past 2 years, she has been having intermittent episodes of crampy lower pelvic pain and diarrhea after eating fried or greasy foods. She was concerned this may be related to her gallbladder. This happens approximately twice a month. She also has alternating constipation and has to take MiraLax every morning at times. No blood in her stool. No upper abdominal pain or dyspepsia symptoms.  Past Medical History  Diagnosis Date  . HTN (hypertension)   . Urolithiasis   . Lumbar disc disease   . OSA (obstructive sleep apnea)   . Chronic kidney disease     hx of kidney stones    Past Surgical History  Procedure Date  . Cesarean section   . Lumbar disc surgery 11-2008  . Tubal ligation 06/2011  . Lithotripsy     Family History  Problem Relation Age of Onset  . Urolithiasis Father   . Kidney disease Father     renal stones  . Thyroid disease Mother   . Thyroid disease Maternal Grandmother     Social History History  Substance Use Topics  . Smoking status: Former Games developer  . Smokeless tobacco: Not on file  . Alcohol Use: No    No Known Allergies  Current Outpatient Prescriptions  Medication Sig Dispense Refill  . HYDROcodone-acetaminophen (VICODIN) 5-500 MG per tablet Take 1 tablet by mouth every 6 (six) hours as needed.        . valACYclovir (VALTREX) 1000 MG tablet 2 tabs q12 x2 doses  4 tablet  0  . zolpidem (AMBIEN) 10 MG tablet Take 10 mg by mouth at bedtime as needed.          Review of Systems Review of Systems  Constitutional: Negative for fever and chills.  Respiratory: Negative.   Cardiovascular: Negative.     Gastrointestinal: Positive for abdominal distention.  Genitourinary: Negative for hematuria.    Blood pressure 124/78, pulse 64, temperature 98.4 F (36.9 C), temperature source Temporal, resp. rate 18, height 5\' 6"  (1.676 m), weight 266 lb 9.6 oz (120.929 kg).  Physical Exam Physical Exam  Constitutional: No distress.       Obese.  HENT:  Head: Normocephalic and atraumatic.  Eyes: Conjunctivae and EOM are normal.  Abdominal: Soft. She exhibits no distension and no mass. There is tenderness (llq). There is no rebound and no guarding.  Musculoskeletal: She exhibits no edema.       No CVA tenderness.    Data Reviewed 09/2008 note.  Assessment    Crampy lower abdominal pain and diarrhea alternating with constipation. I do not believe is related to cholelithiasis.  Her symptoms are highly suspicious for irritable bowel syndrome.    Plan      Referral to gastroenterology for further evaluation and treatment.    Khori Rosevear J 11/20/2011, 10:02 AM

## 2011-11-20 NOTE — Patient Instructions (Signed)
Low fat, high fiber diet

## 2011-11-20 NOTE — Progress Notes (Signed)
Addended by: Milas Hock on: 11/20/2011 10:13 AM   Modules accepted: Orders

## 2011-12-06 ENCOUNTER — Ambulatory Visit (INDEPENDENT_AMBULATORY_CARE_PROVIDER_SITE_OTHER): Payer: 59 | Admitting: Gastroenterology

## 2011-12-06 ENCOUNTER — Encounter: Payer: Self-pay | Admitting: Gastroenterology

## 2011-12-06 ENCOUNTER — Other Ambulatory Visit (INDEPENDENT_AMBULATORY_CARE_PROVIDER_SITE_OTHER): Payer: 59

## 2011-12-06 DIAGNOSIS — R198 Other specified symptoms and signs involving the digestive system and abdomen: Secondary | ICD-10-CM

## 2011-12-06 DIAGNOSIS — R109 Unspecified abdominal pain: Secondary | ICD-10-CM

## 2011-12-06 LAB — HEPATIC FUNCTION PANEL
ALT: 21 U/L (ref 0–35)
Albumin: 4 g/dL (ref 3.5–5.2)
Bilirubin, Direct: 0.1 mg/dL (ref 0.0–0.3)
Total Protein: 7 g/dL (ref 6.0–8.3)

## 2011-12-06 LAB — CBC WITH DIFFERENTIAL/PLATELET
Basophils Absolute: 0 10*3/uL (ref 0.0–0.1)
Basophils Relative: 0.6 % (ref 0.0–3.0)
Eosinophils Absolute: 0.1 10*3/uL (ref 0.0–0.7)
Lymphocytes Relative: 25 % (ref 12.0–46.0)
MCHC: 34.3 g/dL (ref 30.0–36.0)
Monocytes Relative: 7.1 % (ref 3.0–12.0)
Neutrophils Relative %: 66.2 % (ref 43.0–77.0)
RBC: 4.68 Mil/uL (ref 3.87–5.11)
RDW: 12.7 % (ref 11.5–14.6)

## 2011-12-06 LAB — BASIC METABOLIC PANEL
CO2: 26 mEq/L (ref 19–32)
Calcium: 9.3 mg/dL (ref 8.4–10.5)
Chloride: 106 mEq/L (ref 96–112)
Creatinine, Ser: 0.8 mg/dL (ref 0.4–1.2)
Glucose, Bld: 104 mg/dL — ABNORMAL HIGH (ref 70–99)

## 2011-12-06 MED ORDER — ALIGN 4 MG PO CAPS: 1.0000 | ORAL_CAPSULE | Freq: Every day | ORAL | Status: DC

## 2011-12-06 MED ORDER — HYOSCYAMINE SULFATE 0.125 MG SL SUBL: SUBLINGUAL_TABLET | SUBLINGUAL | Status: DC

## 2011-12-06 MED ORDER — PEG-KCL-NACL-NASULF-NA ASC-C 100 G PO SOLR: 1.0000 | Freq: Once | ORAL | Status: DC

## 2011-12-06 NOTE — Patient Instructions (Signed)
Go directly to the basement to have your labs drawn today.  You have been scheduled for a Colonoscopy with propofol. See separate instructions.  Pick up your prep kit from your pharmacy and your prescription for Levsin.  Increase your Miralax to 2-3 x daily.  Start Align samples one tablet by mouth once daily x 1 month. Samples provided to get you started and the rest can be purchased over the counter.  cc: Neena Rhymes, MD       Avel Peace, MD

## 2011-12-06 NOTE — Progress Notes (Signed)
History of Present Illness: This is a 40 year old female who evaluated previously  for chronic constipation. She underwent  colonoscopy in October 2005 that showed mild sigmoid colon diverticulosis and was otherwise normal. She was recommended to use MiraLax once or twice daily and she has maintained once a day MiraLax since that time. Over the past 3 years she has had worsening problems with constipation, going up to 5 days between bowel movements. She has had episodes of urgent diarrhea that may last 1 to 2 days at a time and occasionally has she has normal bowel movements. Her episodes of diarrhea are associated with lower abdominal cramping. She was found to have cholelithiasis and was evaluated by Dr. Abbey Chatters who felt that they were asymptomatic and her symptoms were likely  due to irritable bowel syndrome. Denies weight loss, change in stool caliber, melena, hematochezia, nausea, vomiting, dysphagia, reflux symptoms, chest pain.  Review of Systems: Pertinent positive and negative review of systems were noted in the above HPI section. All other review of systems were otherwise negative.  Current Medications, Allergies, Past Medical History, Past Surgical History, Family History and Social History were reviewed in [employer] record.  Physical Exam: General: Well developed , well nourished, obese, no acute distress Head: Normocephalic and atraumatic Eyes:  sclerae anicteric, EOMI Ears: Normal auditory acuity Mouth: No deformity or lesions Neck: Supple, no masses or thyromegaly Lungs: Clear throughout to auscultation Heart: Regular rate and rhythm; no murmurs, rubs or bruits Abdomen: Soft, non tender and non distended. No masses, hepatosplenomegaly or hernias noted. Normal Bowel sounds Rectal: No lesions, Hemoccult negative brown stool in the vault  Musculoskeletal: Symmetrical with no gross deformities  Skin: No lesions on visible extremities Pulses:  Normal pulses  noted Extremities: No clubbing, cyanosis, edema or deformities noted Neurological: Alert oriented x 4, grossly nonfocal Cervical Nodes:  No significant cervical adenopathy Inguinal Nodes: No significant inguinal adenopathy Psychological:  Alert and cooperative. Normal mood and affect  Assessment and Recommendations:  1. Presumed IBS. R/O IBD. The majority of her symptoms may be driven by her constipation. Increase daily fiber and water intake. Increase Miralax to bid or tid titrate for adequate effect. Trial of probiotics and anti-spasmodics. Bood work ordered. The risks, benefits, and alternatives to colonoscopy with possible biopsy and possible polypectomy were discussed with the patient and they consent to proceed.   2. Cholelithiasis, asymptomatic.

## 2011-12-13 ENCOUNTER — Encounter: Payer: Self-pay | Admitting: *Deleted

## 2011-12-25 DIAGNOSIS — D126 Benign neoplasm of colon, unspecified: Secondary | ICD-10-CM

## 2011-12-25 HISTORY — DX: Benign neoplasm of colon, unspecified: D12.6

## 2011-12-31 ENCOUNTER — Encounter: Payer: 59 | Admitting: Family Medicine

## 2012-01-01 ENCOUNTER — Ambulatory Visit (AMBULATORY_SURGERY_CENTER): Payer: 59 | Admitting: Gastroenterology

## 2012-01-01 ENCOUNTER — Encounter: Payer: Self-pay | Admitting: Gastroenterology

## 2012-01-01 DIAGNOSIS — D126 Benign neoplasm of colon, unspecified: Secondary | ICD-10-CM

## 2012-01-01 DIAGNOSIS — R198 Other specified symptoms and signs involving the digestive system and abdomen: Secondary | ICD-10-CM

## 2012-01-01 DIAGNOSIS — R109 Unspecified abdominal pain: Secondary | ICD-10-CM

## 2012-01-01 MED ORDER — SODIUM CHLORIDE 0.9 % IV SOLN: 500.0000 mL | INTRAVENOUS | Status: DC

## 2012-01-01 NOTE — Progress Notes (Signed)
Patient did not experience any of the following events: a burn prior to discharge; a fall within the facility; wrong site/side/patient/procedure/implant event; or a hospital transfer or hospital admission upon discharge from the facility. (G8907) Patient did not have preoperative order for IV antibiotic SSI prophylaxis. (G8918)  

## 2012-01-01 NOTE — Patient Instructions (Signed)
Please refer to your blue and neon green sheets for instructions regarding diet and activity for the rest of today.  You may resume your medications as you would normally take them.   You will receive a letter in the mail in about two weeks regarding the results of the biopsies taken today.  Diverticulosis Diverticulosis is a common condition that develops when small pouches (diverticula) form in the wall of the colon. The risk of diverticulosis increases with age. It happens more often in people who eat a low-fiber diet. Most individuals with diverticulosis have no symptoms. Those individuals with symptoms usually experience abdominal pain, constipation, or loose stools (diarrhea). HOME CARE INSTRUCTIONS   Increase the amount of fiber in your diet as directed by your caregiver or dietician. This may reduce symptoms of diverticulosis.   Your caregiver may recommend taking a dietary fiber supplement.   Drink at least 6 to 8 glasses of water each day to prevent constipation.   Try not to strain when you have a bowel movement.   Your caregiver may recommend avoiding nuts and seeds to prevent complications, although this is still an uncertain benefit.   Only take over-the-counter or prescription medicines for pain, discomfort, or fever as directed by your caregiver.  FOODS WITH HIGH FIBER CONTENT INCLUDE:  Fruits. Apple, peach, pear, tangerine, raisins, prunes.   Vegetables. Brussels sprouts, asparagus, broccoli, cabbage, carrot, cauliflower, romaine lettuce, spinach, summer squash, tomato, winter squash, zucchini.   Starchy Vegetables. Baked beans, kidney beans, lima beans, split peas, lentils, potatoes (with skin).   Grains. Whole wheat bread, brown rice, bran flake cereal, plain oatmeal, white rice, shredded wheat, bran muffins.  SEEK IMMEDIATE MEDICAL CARE IF:   You develop increasing pain or severe bloating.   You have an oral temperature above 102 F (38.9 C), not controlled by  medicine.   You develop vomiting or bowel movements that are bloody or black.  Document Released: 09/06/2004 Document Revised: 08/22/2011 Document Reviewed: 05/10/2010 ExitCare Patient Information 2012 [employer].  Colon Polyps A polyp is extra tissue that grows inside your body. Colon polyps grow in the large intestine. The large intestine, also called the colon, is part of your digestive system. It is a long, hollow tube at the end of your digestive tract where your body makes and stores stool. Most polyps are not dangerous. They are benign. This means they are not cancerous. But over time, some types of polyps can turn into cancer. Polyps that are smaller than a pea are usually not harmful. But larger polyps could someday become or may already be cancerous. To be safe, doctors remove all polyps and test them.  WHO GETS POLYPS? Anyone can get polyps, but certain people are more likely than others. You may have a greater chance of getting polyps if:  You are over 50.   You have had polyps before.   Someone in your family has had polyps.   Someone in your family has had cancer of the large intestine.   Find out if someone in your family has had polyps. You may also be more likely to get polyps if you:   Eat a lot of fatty foods.   Smoke.   Drink alcohol.   Do not exercise.   Eat too much.  SYMPTOMS  Most small polyps do not cause symptoms. People often do not know they have one until their caregiver finds it during a regular checkup or while testing them for something else. Some people do   have symptoms like these:  Bleeding from the anus. You might notice blood on your underwear or on toilet paper after you have had a bowel movement.   Constipation or diarrhea that lasts more than a week.   Blood in the stool. Blood can make stool look black or it can show up as red streaks in the stool.  If you have any of these symptoms, see your caregiver. HOW DOES THE DOCTOR TEST FOR  POLYPS? The doctor can use four tests to check for polyps:  Digital rectal exam. The caregiver wears gloves and checks your rectum (the last part of the large intestine) to see if it feels normal. This test would find polyps only in the rectum. Your caregiver may need to do one of the other tests listed below to find polyps higher up in the intestine.   Barium enema. The caregiver puts a liquid called barium into your rectum before taking x-rays of your large intestine. Barium makes your intestine look white in the pictures. Polyps are dark, so they are easy to see.   Sigmoidoscopy. With this test, the caregiver can see inside your large intestine. A thin flexible tube is placed into your rectum. The device is called a sigmoidoscope, which has a light and a tiny video camera in it. The caregiver uses the sigmoidoscope to look at the last third of your large intestine.   Colonoscopy. This test is like sigmoidoscopy, but the caregiver looks at all of the large intestine. It usually requires sedation. This is the most common method for finding and removing polyps.  TREATMENT   The caregiver will remove the polyp during sigmoidoscopy or colonoscopy. The polyp is then tested for cancer.   If you have had polyps, your caregiver may want you to get tested regularly in the future.  PREVENTION  There is not one sure way to prevent polyps. You might be able to lower your risk of getting them if you:  Eat more fruits and vegetables and less fatty food.   Do not smoke.   Avoid alcohol.   Exercise every day.   Lose weight if you are overweight.   Eating more calcium and folate can also lower your risk of getting polyps. Some foods that are rich in calcium are milk, cheese, and broccoli. Some foods that are rich in folate are chickpeas, kidney beans, and spinach.   Aspirin might help prevent polyps. Studies are under way.  Document Released: 09/05/2004 Document Revised: 08/22/2011 Document Reviewed:  02/11/2008 ExitCare Patient Information 2012 [employer]. 

## 2012-01-01 NOTE — Op Note (Signed)
[facility] [address] [place], [place]  [address]  COLONOSCOPY PROCEDURE REPORT PATIENT:  Monique Gross, Monique Gross  MR#:  [id number] BIRTHDATE:  Oct 02, 1971, 40 yrs. old  GENDER:  female ENDOSCOPIST:  Judie Petit T. Russella Dar, MD, [facility] Referred by:  Avel Peace, M.D. PROCEDURE DATE:  01/01/2012 PROCEDURE:  Colonoscopy with snare polypectomy ASA CLASS:  Class II INDICATIONS:  1) change in bowel habits  2) abdominal pain MEDICATIONS:   MAC sedation, administered by CRNA, propofol (Diprivan) 300 mg IV DESCRIPTION OF PROCEDURE:   After the risks benefits and alternatives of the procedure were thoroughly explained, informed consent was obtained.  Digital rectal exam was performed and revealed no abnormalities.   The [id number] endoscope was introduced through the anus and advanced to the cecum, which was identified by both the appendix and ileocecal valve, without limitations.  The quality of the prep was excellent, using MoviPrep.  The instrument was then slowly withdrawn as the colon was fully examined. <<PROCEDUREIMAGES>> FINDINGS:  A sessile polyp was found in the ascending colon. It was 5 mm in size. Polyp was snared without cautery. Retrieval was successful.  A sessile polyp was found in the sigmoid colon. It was 5 mm in size. Polyp was snared without cautery. Retrieval was successful. Moderate diverticulosis was found in the sigmoid colon. The terminal ileum appeared normal.  Otherwise normal colonoscopy without other polyps, masses, vascular ectasias, or inflammatory changes.  Retroflexed views in the rectum revealed no abnormalities. The time to cecum =  3.25  minutes. The scope was then withdrawn (time =  10.75  min) from the patient and the procedure completed.  COMPLICATIONS:  None  ENDOSCOPIC IMPRESSION: 1) 5 mm sessile polyp in the ascending colon 2) 5 mm sessile polyp in the sigmoid colon 3) Moderate diverticulosis in the sigmoid colon 4) Normal terminal  ileum  RECOMMENDATIONS: 1) Await pathology results 2) High fiber diet with liberal fluid intake. 3) If the polyps are adenomatous (pre-cancerous), repeat colonoscopy in 5 years. Otherwise follow colorectal cancer screening guidelines for "routine risk" patients with colonoscopy at age 37 years. 4) Continue treatment for IBS  Sorrel Cassetta T. Russella Dar, MD, [facility]  n. eSIGNED:   Venita Lick. Mikya Don at 01/01/2012 02:28 PM  Sheppard Penton, [phone]

## 2012-01-02 ENCOUNTER — Telehealth: Payer: Self-pay | Admitting: *Deleted

## 2012-01-02 NOTE — Telephone Encounter (Signed)
Left message

## 2012-01-07 ENCOUNTER — Encounter: Payer: Self-pay | Admitting: Gastroenterology

## 2012-01-16 ENCOUNTER — Encounter: Payer: Self-pay | Admitting: Family Medicine

## 2012-01-16 ENCOUNTER — Ambulatory Visit (INDEPENDENT_AMBULATORY_CARE_PROVIDER_SITE_OTHER): Payer: 59 | Admitting: Family Medicine

## 2012-01-16 DIAGNOSIS — Z79899 Other long term (current) drug therapy: Secondary | ICD-10-CM

## 2012-01-16 DIAGNOSIS — F329 Major depressive disorder, single episode, unspecified: Secondary | ICD-10-CM | POA: Insufficient documentation

## 2012-01-16 DIAGNOSIS — F32A Depression, unspecified: Secondary | ICD-10-CM | POA: Insufficient documentation

## 2012-01-16 DIAGNOSIS — R7989 Other specified abnormal findings of blood chemistry: Secondary | ICD-10-CM | POA: Insufficient documentation

## 2012-01-16 DIAGNOSIS — F341 Dysthymic disorder: Secondary | ICD-10-CM

## 2012-01-16 DIAGNOSIS — F419 Anxiety disorder, unspecified: Secondary | ICD-10-CM

## 2012-01-16 DIAGNOSIS — Z0000 Encounter for general adult medical examination without abnormal findings: Secondary | ICD-10-CM | POA: Insufficient documentation

## 2012-01-16 DIAGNOSIS — R6889 Other general symptoms and signs: Secondary | ICD-10-CM

## 2012-01-16 LAB — LIPID PANEL
LDL Cholesterol: 76 mg/dL (ref 0–99)
Total CHOL/HDL Ratio: 3

## 2012-01-16 LAB — TSH: TSH: 17.45 u[IU]/mL — ABNORMAL HIGH (ref 0.35–5.50)

## 2012-01-16 MED ORDER — CITALOPRAM HYDROBROMIDE 20 MG PO TABS: 20.0000 mg | ORAL_TABLET | Freq: Every day | ORAL | Status: DC

## 2012-01-16 NOTE — Patient Instructions (Signed)
Follow up in 1 month to recheck mood Start the Lexapro daily We'll notify you of your lab results Call with any questions or concerns HANG IN THERE!!!

## 2012-01-16 NOTE — Assessment & Plan Note (Signed)
Pt's PE WNL.  UTD on GYN, colonoscopy.  Will check lipids and Vit D as she has not had this done.  Anticipatory guidance provided.

## 2012-01-16 NOTE — Assessment & Plan Note (Signed)
New.  Pt is overwhelmed w/ work and stress of being a mom.  Impacting sleep.  Losing interest in things.  Start low dose SSRI daily and follow closely.  Pt expressed understanding and is in agreement w/ plan.

## 2012-01-16 NOTE — Progress Notes (Signed)
  Subjective:    Patient ID: Monique Gross, female    DOB: Jul 31, 1971, 41 y.o.   MRN: [id number]  HPI CPE- UTD on GYN, colonoscopy.  Anxiety- pt reports sxs x2 yrs.  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  Reports she is 'unable to relax'.  Difficulty falling asleep- Ambien not working.  'everything stresses me out'.  Crying frequently.  Has lost interest in things she used to enjoy.  Abnormal TSH- GI checked labs in Dec, TSH elevated.  Due for repeat.  Strong family hx.   Review of Systems Patient reports no vision/ hearing changes, adenopathy,fever, weight change,  persistant/recurrent hoarseness , swallowing issues, chest pain, palpitations, edema, persistant/recurrent cough, hemoptysis, dyspnea (rest/exertional/paroxysmal nocturnal), gastrointestinal bleeding (melena, rectal bleeding), abdominal pain, significant heartburn, bowel changes, GU symptoms (dysuria, hematuria, incontinence), Gyn symptoms (abnormal  bleeding, pain),  syncope, focal weakness, memory loss, numbness & tingling, skin/hair/nail changes, abnormal bruising or bleeding.    Objective:   Physical Exam General Appearance:    Alert, cooperative, no distress, appears stated age  Head:    Normocephalic, without obvious abnormality, atraumatic  Eyes:    PERRL, conjunctiva/corneas clear, EOM's intact, fundi    benign, both eyes  Ears:    Normal TM's and external ear canals, both ears  Nose:   Nares normal, septum midline, mucosa normal, no drainage    or sinus tenderness  Throat:   Lips, mucosa, and tongue normal; teeth and gums normal  Neck:   Supple, symmetrical, trachea midline, no adenopathy;    Thyroid: no enlargement/tenderness/nodules  Back:     Symmetric, no curvature, ROM normal, no CVA tenderness  Lungs:     Clear to auscultation bilaterally, respirations unlabored  Chest Wall:    No tenderness or deformity   Heart:    Regular rate and rhythm, S1 and S2  normal, no murmur, rub   or gallop  Breast Exam:    Deferred to GYN  Abdomen:     Soft, non-tender, bowel sounds active all four quadrants,    no masses, no organomegaly  Genitalia:    Deferred to GYN  Rectal:    Extremities:   Extremities normal, atraumatic, no cyanosis or edema  Pulses:   2+ and symmetric all extremities  Skin:   Skin color, texture, turgor normal, no rashes or lesions  Lymph nodes:   Cervical, supraclavicular, and axillary nodes normal  Neurologic:   CNII-XII intact, normal strength, sensation and reflexes    throughout          Assessment & Plan:

## 2012-01-16 NOTE — Assessment & Plan Note (Signed)
New.  Pt w/ strong family hx of hypothyroid.  Will repeat TSH and add T3/T4.

## 2012-01-17 LAB — T4, FREE: Free T4: 0.53 ng/dL — ABNORMAL LOW (ref 0.60–1.60)

## 2012-01-18 ENCOUNTER — Encounter: Payer: Self-pay | Admitting: *Deleted

## 2012-01-19 LAB — VITAMIN D 1,25 DIHYDROXY
Vitamin D 1, 25 (OH)2 Total: 82 pg/mL — ABNORMAL HIGH (ref 18–72)
Vitamin D2 1, 25 (OH)2: <8 pg/mL
Vitamin D3 1, 25 (OH)2: 82 pg/mL

## 2012-01-21 ENCOUNTER — Other Ambulatory Visit: Payer: Self-pay | Admitting: *Deleted

## 2012-01-21 MED ORDER — LEVOTHYROXINE SODIUM 100 MCG PO CAPS: 1.0000 | ORAL_CAPSULE | Freq: Every day | ORAL | Status: DC

## 2012-01-21 NOTE — Telephone Encounter (Signed)
rx sent to pharmacy by [facility] for levothroxine  Apologized for pt inconvenience

## 2012-02-01 ENCOUNTER — Emergency Department (HOSPITAL_BASED_OUTPATIENT_CLINIC_OR_DEPARTMENT_OTHER)
Admission: EM | Admit: 2012-02-01 | Discharge: 2012-02-01 | Disposition: A | Discharge status: Home / Self Care | Payer: 59 | Attending: Emergency Medicine | Admitting: Emergency Medicine

## 2012-02-01 ENCOUNTER — Other Ambulatory Visit: Payer: Self-pay

## 2012-02-01 ENCOUNTER — Encounter (HOSPITAL_BASED_OUTPATIENT_CLINIC_OR_DEPARTMENT_OTHER): Payer: Self-pay | Admitting: Family Medicine

## 2012-02-01 ENCOUNTER — Telehealth: Payer: Self-pay | Admitting: Family Medicine

## 2012-02-01 DIAGNOSIS — R55 Syncope and collapse: Secondary | ICD-10-CM | POA: Insufficient documentation

## 2012-02-01 DIAGNOSIS — J329 Chronic sinusitis, unspecified: Secondary | ICD-10-CM

## 2012-02-01 DIAGNOSIS — I10 Essential (primary) hypertension: Secondary | ICD-10-CM | POA: Insufficient documentation

## 2012-02-01 DIAGNOSIS — K589 Irritable bowel syndrome without diarrhea: Secondary | ICD-10-CM | POA: Insufficient documentation

## 2012-02-01 DIAGNOSIS — F419 Anxiety disorder, unspecified: Secondary | ICD-10-CM

## 2012-02-01 DIAGNOSIS — F341 Dysthymic disorder: Secondary | ICD-10-CM | POA: Insufficient documentation

## 2012-02-01 DIAGNOSIS — R6889 Other general symptoms and signs: Secondary | ICD-10-CM | POA: Insufficient documentation

## 2012-02-01 DIAGNOSIS — F32A Depression, unspecified: Secondary | ICD-10-CM

## 2012-02-01 DIAGNOSIS — R7989 Other specified abnormal findings of blood chemistry: Secondary | ICD-10-CM

## 2012-02-01 DIAGNOSIS — G43909 Migraine, unspecified, not intractable, without status migrainosus: Secondary | ICD-10-CM | POA: Insufficient documentation

## 2012-02-01 HISTORY — DX: Migraine, unspecified, not intractable, without status migrainosus: G43.909

## 2012-02-01 HISTORY — DX: Irritable bowel syndrome, unspecified: K58.9

## 2012-02-01 LAB — CBC
HCT: 40.6 % (ref 36.0–46.0)
Hemoglobin: 14.2 g/dL (ref 12.0–15.0)
MCV: 88.3 fL (ref 78.0–100.0)
RBC: 4.6 MIL/uL (ref 3.87–5.11)
RDW: 12.1 % (ref 11.5–15.5)
WBC: 8.7 10*3/uL (ref 4.0–10.5)

## 2012-02-01 LAB — CARDIAC PANEL(CRET KIN+CKTOT+MB+TROPI)
CK, MB: 3 ng/mL (ref 0.3–4.0)
Relative Index: INVALID (ref 0.0–2.5)
Total CK: 79 U/L (ref 7–177)
Troponin I: <0.3 ng/mL (ref <0.30)

## 2012-02-01 LAB — BASIC METABOLIC PANEL WITH GFR
BUN: 16 mg/dL (ref 6–23)
CO2: 26 meq/L (ref 19–32)
Calcium: 9.3 mg/dL (ref 8.4–10.5)
Chloride: 103 meq/L (ref 96–112)
Creatinine, Ser: 0.7 mg/dL (ref 0.50–1.10)
GFR calc Af Amer: >90 mL/min (ref >90)
GFR calc non Af Amer: >90 mL/min (ref >90)
Glucose, Bld: 85 mg/dL (ref 70–99)
Potassium: 4 meq/L (ref 3.5–5.1)
Sodium: 138 meq/L (ref 135–145)

## 2012-02-01 LAB — PREGNANCY, URINE: Preg Test, Ur: NEGATIVE

## 2012-02-01 MED ORDER — AZITHROMYCIN 250 MG PO TABS: 250.0000 mg | ORAL_TABLET | Freq: Every day | ORAL | Status: DC
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
## 2012-02-01 NOTE — ED Provider Notes (Signed)
History    CSN: [id number]  Arrival date & time 02/01/12  1015   First MD Initiated Contact with Patient 02/01/12 1041     Chief Complaint  Patient presents with  . Loss of Consciousness   Patient is a 41 y.o. female presenting with syncope. The history is provided by the patient and the spouse.  Loss of Consciousness This is a new problem. The current episode started today (Happened this morning while she was pulling out of a parking lot, she had feeling of doom, slammed the breaks, and then head to steering wheel.). Episode frequency: Never before. The problem has been gradually improving. Associated symptoms include chest pain, congestion, diaphoresis and a visual change. Pertinent negatives include no abdominal pain, anorexia, arthralgias, change in bowel habit, chills, coughing, fatigue, fever, headaches, myalgias, nausea, neck pain, numbness, rash, sore throat, swollen glands, vomiting or weakness. Associated symptoms comments: Blurry vision prior to LOC accompanied by "dimming lights."; also accompanied by dizziness/light headedness; CP described as chest tightness; denies tongue biting & urinary/bowel incontinence.  Endorses confusion & feeling tremulous upon coming back to". The symptoms are aggravated by nothing. She has tried nothing for the symptoms.   Hasn't been feeling well generally over the last two weeks due to presumed sinus infection.  Has had accompanying nose bleeds, which she hasn't had before.  Had migraine this AM after which she took 2 Excedrin, with some relief.  Was recently started on synthroid for hypothyroidism & SSRI for anxiety.  Has history of IBS, diarrhea predominant, including 2 loose BMs/week.  No family or personal history of seizure disorder or MI.    No new recent life stressors.  Past Medical History  Diagnosis Date  . HTN (hypertension)   . Urolithiasis   . Lumbar disc disease   . OSA (obstructive sleep apnea)   . Chronic kidney disease     hx of  kidney stones  . Diverticulosis   . Migraines   . IBS (irritable bowel syndrome)    Past Surgical History  Procedure Date  . Cesarean section   . Lumbar disc surgery 11-2008  . Tubal ligation 06/2011  . Lithotripsy   . Back surgery    Family History  Problem Relation Age of Onset  . Urolithiasis Father   . Kidney disease Father     renal stones  . Thyroid disease Mother   . Thyroid disease Maternal Grandmother   . Diabetes Maternal Grandfather    History  Substance Use Topics  . Smoking status: Former Games developer  . Smokeless tobacco: Never Used  . Alcohol Use: No   OB History    Grav Para Term Preterm Abortions TAB SAB Ect Mult Living                 Review of Systems  Constitutional: Positive for diaphoresis. Negative for fever, chills, appetite change and fatigue.  HENT: Positive for nosebleeds, congestion and rhinorrhea. Negative for sore throat, trouble swallowing, neck pain, neck stiffness and tinnitus.   Respiratory: Positive for chest tightness. Negative for cough, shortness of breath and wheezing.   Cardiovascular: Positive for chest pain and syncope. Negative for palpitations and leg swelling.  Gastrointestinal: Positive for blood in stool. Negative for nausea, vomiting, abdominal pain, anorexia and change in bowel habit.  Genitourinary: Negative for frequency, flank pain and difficulty urinating.  Musculoskeletal: Negative for myalgias, back pain and arthralgias.  Skin: Negative for rash.  Neurological: Positive for dizziness, tremors and light-headedness. Negative for weakness, numbness  and headaches.  Psychiatric/Behavioral: Positive for confusion. Negative for hallucinations. The patient is nervous/anxious.    Allergies  Review of patient's allergies indicates no known allergies.  Home Medications   Current Outpatient Rx  Name Route Sig Dispense Refill  . CITALOPRAM HYDROBROMIDE 20 MG PO TABS Oral Take 1 tablet (20 mg total) by mouth daily. 30 tablet 2  .  HYDROCODONE-ACETAMINOPHEN 5-500 MG PO TABS Oral Take 1 tablet by mouth every 6 (six) hours as needed.      Marland Kitchen HYOSCYAMINE SULFATE 0.125 MG SL SUBL  1-2 tablets by mouth four times a day as needed for abdominal pain 60 tablet 11  . LEVOTHYROXINE SODIUM 100 MCG PO CAPS Oral Take 1 capsule (100 mcg total) by mouth daily. 30 capsule 3  . POLYETHYLENE GLYCOL 3350 PO PACK Oral Take 17 g by mouth daily.      Marland Kitchen ALIGN 4 MG PO CAPS Oral Take 1 capsule by mouth daily. 30 capsule 0    Samples given to patient    Lot# [id number]        ...  . ZOLPIDEM TARTRATE 10 MG PO TABS Oral Take 10 mg by mouth at bedtime as needed.        BP 176/96  Pulse 76  Temp(Src) 98.6 F (37 C) (Oral)  Resp 20  SpO2 96%  Physical Exam  Vitals reviewed. Constitutional: She is oriented to person, place, and time. She appears well-developed and well-nourished.  HENT:  Mouth/Throat: Oropharynx is clear and moist.  Eyes: Conjunctivae and EOM are normal. Pupils are equal, round, and reactive to light.       No conjunctival pallor  Neck: Normal range of motion. Neck supple. No thyromegaly present.  Cardiovascular: Normal rate, regular rhythm, normal heart sounds and intact distal pulses.  Exam reveals no gallop and no friction rub.   No murmur heard. Pulmonary/Chest: Effort normal and breath sounds normal. No respiratory distress. She has no wheezes. She has no rales. She exhibits no tenderness.  Abdominal: Soft. Bowel sounds are normal. She exhibits no distension and no mass. There is no tenderness. There is no rebound and no guarding.  Musculoskeletal: Normal range of motion.  Neurological: She is alert and oriented to person, place, and time. She has normal reflexes. No cranial nerve deficit. Coordination normal.  Skin: Skin is warm and dry. She is not diaphoretic.  Psychiatric: She has a normal mood and affect. Her behavior is normal. Judgment and thought content normal.    ED Course  Procedures (including critical care  time)   Labs Reviewed  BASIC METABOLIC PANEL  CBC  CARDIAC PANEL(CRET KIN+CKTOT+MB+TROPI)  PREGNANCY, URINE   No results found.  1. Sinusitis   2. Abnormal TSH   3. Anxiety and depression   4. Migraine     MDM  41 yo F with PMH significant for anxiety d/o, hypothyroidism & migraines p/w new onset syncope x 1 episode this morning.  MI ruled out by negative EKG & cardiac panel.  Unlikely seizure given no history of sz d/o and normal CK.  May be migraine related compounded by sinus infection x 2 weeks.  May be medication side effect (celexa and synthroid recently started). Serotonin syndrome unlikely in setting of normal HR and labs.  Patient is not anemic.  No electrolyte abnormalities.  Glucose wnl.  Patient has history of anxiety/depression, may be panic attack, but patient denies acute stressful factors.        Vernice Jefferson, MD 02/01/12 1231
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
## 2012-02-01 NOTE — ED Provider Notes (Signed)
I saw and evaluated the patient, reviewed the resident's note and I agree with the findings and plan.  Date: 02/01/2012  Rate: 67  Rhythm: normal sinus rhythm  QRS Axis: normal  Intervals: normal  ST/T Wave abnormalities: normal  Conduction Disutrbances:none  Narrative Interpretation:   Old EKG Reviewed: none available  Patient with a syncopal episode today which was most likely vagal versus orthostatic. She had prodromal symptoms which culminated to the syncope and then relieved. No focal symptoms. No symptoms suggestive of a cardiac cause she is not pregnant and has normal labs. Feeling fine now unable to ambulate without difficulty. Will discharge and have her followup with her regular doctor.  Gwyneth Sprout, MD 02/01/12 [phone]

## 2012-02-05 ENCOUNTER — Ambulatory Visit (INDEPENDENT_AMBULATORY_CARE_PROVIDER_SITE_OTHER): Payer: 59 | Admitting: Family Medicine

## 2012-02-05 VITALS — BP 130/90 | HR 85 | Temp 98.0°F | Ht 66.0 in | Wt 267.4 lb

## 2012-02-05 DIAGNOSIS — R55 Syncope and collapse: Secondary | ICD-10-CM

## 2012-02-05 LAB — BASIC METABOLIC PANEL
BUN: 18 mg/dL (ref 6–23)
Calcium: 9.3 mg/dL (ref 8.4–10.5)
Chloride: 106 mEq/L (ref 96–112)
Creatinine, Ser: 0.8 mg/dL (ref 0.4–1.2)
GFR: 85.09 mL/min (ref >60.00)

## 2012-02-05 LAB — HEMOGLOBIN A1C: Hgb A1c MFr Bld: 5.4 % (ref 4.6–6.5)

## 2012-02-05 NOTE — Patient Instructions (Signed)
Your symptoms appear to be related to low blood sugar Make sure you are eating protein at each meal to keep your blood sugar stable Eat a mid-morning and mid-afternoon snack Drink plenty of fluids We'll notify you of your lab results Call with any questions or concerns Hang in there!

## 2012-02-05 NOTE — Progress Notes (Signed)
  Subjective:    Patient ID: Monique Gross, female    DOB: 04-Jul-1971, 41 y.o.   MRN: [id number]  HPI Was sitting in waiting room today when she 'lost my hearing, had tunnel vision.  Fuzzy feeling, disoriented in the head.  But i never blacked out'.  Reports as she concentrated on the check in desk, 'i came back'.  Last Friday AM took 2 excedrin at 5:30 AM, at 6 took Synthroid, at 7 took Celexa.  Went to grocery store and felt fine.  Leaving the grocery store 'i didn't feel right'.  Pulling out of the parking lot, developed tunnel vision, put the car in park- fell forward w/ head on steering wheel 'for a few seconds'.  Got back to work- was white, shaky according to co-workers.  Went to ER- had normal EKG, labs.  BP was elevated.  Had eaten raisin bran that morning.  Had no similar episodes between Friday and today.  Episodes occurred at same time both days.  Drinking lots of water.  Eats breakfast at 7am- sxs occuring over 2.5 hrs after.   Review of Systems For ROS see HPI     Objective:   Physical Exam  Vitals reviewed. Constitutional: She is oriented to person, place, and time. She appears well-developed and well-nourished. No distress.  HENT:  Head: Normocephalic and atraumatic.  Eyes: Conjunctivae and EOM are normal. Pupils are equal, round, and reactive to light.  Neck: Normal range of motion. Neck supple. No thyromegaly present.  Cardiovascular: Normal rate, regular rhythm, normal heart sounds and intact distal pulses.   No murmur heard. Pulmonary/Chest: Effort normal and breath sounds normal. No respiratory distress.  Abdominal: Soft. She exhibits no distension. There is no tenderness.  Musculoskeletal: She exhibits no edema.  Lymphadenopathy:    She has no cervical adenopathy.  Neurological: She is alert and oriented to person, place, and time. She has normal reflexes. No cranial nerve deficit. Coordination normal.  Skin: Skin is warm and dry.  Psychiatric: She has a normal  mood and affect. Her behavior is normal.          Assessment & Plan:

## 2012-02-18 ENCOUNTER — Ambulatory Visit: Payer: 59 | Admitting: Family Medicine

## 2012-02-19 NOTE — Assessment & Plan Note (Signed)
New.  Pt had ER work up- negative for cardiac abnormality (reviewed records).  sxs and associated prodrome consistent w/ hypoglycemia and only started after pt made dramatic changes to diet.  Check labs to r/o diabetes.  Reviewed importance of protein at every meal and eating small but regularly spaced meals.  Reviewed supportive care and red flags that should prompt return.  Pt expressed understanding and is in agreement w/ plan.

## 2012-02-25 ENCOUNTER — Encounter: Payer: Self-pay | Admitting: Family Medicine

## 2012-02-25 ENCOUNTER — Ambulatory Visit (INDEPENDENT_AMBULATORY_CARE_PROVIDER_SITE_OTHER): Payer: 59 | Admitting: Family Medicine

## 2012-02-25 DIAGNOSIS — R55 Syncope and collapse: Secondary | ICD-10-CM

## 2012-02-25 DIAGNOSIS — E039 Hypothyroidism, unspecified: Secondary | ICD-10-CM

## 2012-02-25 DIAGNOSIS — R21 Rash and other nonspecific skin eruption: Secondary | ICD-10-CM | POA: Insufficient documentation

## 2012-02-25 LAB — TSH: TSH: 2.97 u[IU]/mL (ref 0.35–5.50)

## 2012-02-25 NOTE — Progress Notes (Signed)
  Subjective:    Patient ID: Monique Gross, female    DOB: 18-Jun-1971, 41 y.o.   MRN: [id number]  HPI Syncope- had 5 episodes since last visit but none in 10 days.  sxs always occurred between 9:30 and 10:30.  2 episodes 'i was down', 3 times was pre-syncopal.  Is now doing mid-morning snacks.  sxs will always have prodromal warning.  Hypothyroid- 'restless'.  'i'm unable to relax'.  Was started on Synthroid at last visit.  Rash- under L arm, present 'for a couple weeks'.  Denies itching, burning, pain.  No drainage.  No hx of similar.  Review of Systems For ROS see HPI     Objective:   Physical Exam  Constitutional: She is oriented to person, place, and time. She appears well-developed and well-nourished. No distress.  HENT:  Head: Normocephalic and atraumatic.  Eyes: Conjunctivae and EOM are normal. Pupils are equal, round, and reactive to light.  Neck: Normal range of motion. Neck supple. No thyromegaly present.  Cardiovascular: Normal rate, regular rhythm, normal heart sounds and intact distal pulses.   No murmur heard. Pulmonary/Chest: Effort normal and breath sounds normal. No respiratory distress.  Abdominal: Soft. She exhibits no distension. There is no tenderness.  Musculoskeletal: She exhibits no edema.  Lymphadenopathy:    She has no cervical adenopathy.  Neurological: She is alert and oriented to person, place, and time.  Skin: Skin is warm and dry. Rash (pt w/ cluster of either scabbed vesicles or irritated skin tags/warts under L arm.  no surrounding redness, drainage, induration) noted.  Psychiatric: She has a normal mood and affect. Her behavior is normal.          Assessment & Plan:

## 2012-02-25 NOTE — Patient Instructions (Signed)
We'll notify you of your thyroid level and adjust meds as needed Break the Celexa in half temporarily Apply hydrocortisone cream to the rash twice daily- if no improvement, call and we'll send you to derm If you have another episode of blacking out- call me and we'll do a CT scan Make sure you are eating a mix of carbs and protein at each meal Call with any questions or concerns Hang in there!!!

## 2012-02-25 NOTE — Assessment & Plan Note (Signed)
New.  Appearance consistent w/ scabbed shingles but pt denies pain or burning.  Area also consistent w/ irritated skin tags/warts.  Start hydrocortisone to heal area and calm irritation.  If no improvement, will need derm referral.  Pt expressed understanding and is in agreement w/ plan.

## 2012-02-25 NOTE — Assessment & Plan Note (Signed)
In last 10 days pt has not had an episode.  Has changed her diet to include balance of carbs and protein along w/ mid-morning snack.  Given fact that pt's sxs only occur mid morning and always have prodrome it seems highly likely that it is related to hypoglycemia.  Will continue to follow.  Pt expressed understanding and is in agreement w/ plan.

## 2012-02-25 NOTE — Assessment & Plan Note (Signed)
New.  Pt was started on Synthroid at last visit.  Since this time has felt very 'wired'- unable to 'relax'.  Suspect that synthroid dose is too high and will need to be adjusted.  Check labs.

## 2012-02-29 ENCOUNTER — Telehealth: Payer: Self-pay | Admitting: Family Medicine

## 2012-02-29 NOTE — Telephone Encounter (Signed)
Patient called & would like her lab results. Please call at work ph# [phone] ext [phone], please do not leave a voicemail. If you get to her voicemail please press 0 & have her paged  Thanks

## 2012-02-29 NOTE — Telephone Encounter (Signed)
Pt indicated that she has not viewed results in my chart yet. Advise Pt of the following:  Entered by Sheliah Hatch, MD at 02/25/2012 3:41 PM TSH is now normal but it has dropped from 17 to 3 in a month- which may be why you are feeling so jittery. Based on the normal TSH, I would not adjust your thyroid med at this time- but if your symptoms continue, please call me!

## 2012-02-29 NOTE — Telephone Encounter (Signed)
Pt's TSH results were released and commented on via MyChart.  If she did not get it, please call and give her the info

## 2012-03-04 ENCOUNTER — Telehealth: Payer: Self-pay | Admitting: Family Medicine

## 2012-03-04 ENCOUNTER — Encounter: Payer: Self-pay | Admitting: Family Medicine

## 2012-03-04 NOTE — Telephone Encounter (Signed)
Last OV noted 02-25-12 per advised that she could be sent to derm if continues to have rash issues, spoke to MD Lowne to review in absence of MD Tabori and was verbally advised

## 2012-03-04 NOTE — Telephone Encounter (Signed)
Continued from last note at 3:14pm, per system will not allow an addendum for this telephone call, was advised verbally per MD Lowne to contact pt to clarify if the rash is only on one side of her body or if it is located in more than one area, also to ask if the rash is in a line formation, left voicemail for pt to call office back to clarify before any further instructions can be given.

## 2012-03-04 NOTE — Telephone Encounter (Signed)
Patient states the rash she was seen for recently has spread 1-2 inches and is now painful. She states that when she was in the office, there was a question as to whether or not it was Shingles and patient now believes it is. She would like to know if Dr. Beverely Low will call her in some medicine. [facility] in [place]. Patient was sent to [facility], but could not leave direct call back #.

## 2012-03-05 MED ORDER — VALACYCLOVIR HCL 1 G PO TABS: 1000.0000 mg | ORAL_TABLET | Freq: Three times a day (TID) | ORAL | Status: AC

## 2012-03-05 NOTE — Telephone Encounter (Signed)
Called pt and she clarified the blisters have spread to about 1inch in diameter and it burns, located only under arm area, gave verbal instructions per MD Beverely Low that she will send her some medication to her pharmacy clarified as [facility] [place]

## 2012-03-05 NOTE — Telephone Encounter (Signed)
If rash is unilateral, painful blisters- will call in Valtrex 1000mg  TID x7 days

## 2012-03-05 NOTE — Telephone Encounter (Signed)
Please note, will call again today

## 2012-03-05 NOTE — Telephone Encounter (Signed)
Called and spoke to pt clarifying that the rash is unilateral with painful and burning blisters, sent pt valtrex 1000mg  tid to pharmacy, pt aware

## 2012-03-28 ENCOUNTER — Ambulatory Visit (INDEPENDENT_AMBULATORY_CARE_PROVIDER_SITE_OTHER): Payer: 59 | Admitting: Family Medicine

## 2012-03-28 ENCOUNTER — Encounter: Payer: Self-pay | Admitting: Family Medicine

## 2012-03-28 VITALS — BP 118/80 | HR 77 | Temp 98.3°F | Ht 66.0 in | Wt 264.6 lb

## 2012-03-28 DIAGNOSIS — R21 Rash and other nonspecific skin eruption: Secondary | ICD-10-CM

## 2012-03-28 DIAGNOSIS — J209 Acute bronchitis, unspecified: Secondary | ICD-10-CM

## 2012-03-28 MED ORDER — GUAIFENESIN-CODEINE 100-10 MG/5ML PO SYRP: 10.0000 mL | ORAL_SOLUTION | Freq: Three times a day (TID) | ORAL | Status: AC | PRN

## 2012-03-28 MED ORDER — LEVOTHYROXINE SODIUM 100 MCG PO TABS: 100.0000 ug | ORAL_TABLET | Freq: Every day | ORAL | Status: DC

## 2012-03-28 MED ORDER — BENZONATATE 200 MG PO CAPS: 200.0000 mg | ORAL_CAPSULE | Freq: Three times a day (TID) | ORAL | Status: AC | PRN

## 2012-03-28 MED ORDER — CITALOPRAM HYDROBROMIDE 20 MG PO TABS: 20.0000 mg | ORAL_TABLET | Freq: Every day | ORAL | Status: DC

## 2012-03-28 MED ORDER — ALBUTEROL SULFATE (5 MG/ML) 0.5% IN NEBU
2.5000 mg | INHALATION_SOLUTION | Freq: Once | RESPIRATORY_TRACT | Status: AC
  Administered 2012-03-28: 2.5 mg via RESPIRATORY_TRACT

## 2012-03-28 MED ORDER — DOXYCYCLINE HYCLATE 100 MG PO TABS: 100.0000 mg | ORAL_TABLET | Freq: Two times a day (BID) | ORAL | Status: AC

## 2012-03-28 NOTE — Patient Instructions (Signed)
Start the Doxycycline twice daily for the bronchitis Use the Symbicort inhaler- 2 puffs twice daily while not feeling well Use the cough meds as needed Drink plenty of fluids Wash your deodorant off nightly and apply neosporin before bed Call with any questions or concerns Hang in there!

## 2012-03-28 NOTE — Progress Notes (Signed)
  Subjective:    Patient ID: Monique Gross, female    DOB: 1971/03/06, 41 y.o.   MRN: [id number]  HPI Cough- sxs started 2 weeks ago.  Was given Zpack last week at [facility].  Sinus pain and pressure has improved but 'now it's in my chest'.  Cough is wet but not productive.  Now having nausea and diarrhea.  No fevers.  No ear pain, no nasal congestion.  + sick contacts.  Shingles- improving.  Reports sxs are now bilateral under arms.  Still having burning under arms- particularly at night.  Feels she 'cross contaminated' w/ deodorant.   Review of Systems For ROS see HPI     Objective:   Physical Exam  Vitals reviewed. Constitutional: She appears well-developed and well-nourished. No distress.  HENT:  Head: Normocephalic and atraumatic.       TMs normal bilaterally Mild nasal congestion Throat w/out erythema, edema, or exudate  Eyes: Conjunctivae and EOM are normal. Pupils are equal, round, and reactive to light.  Neck: Normal range of motion. Neck supple.  Cardiovascular: Normal rate, regular rhythm, normal heart sounds and intact distal pulses.   No murmur heard. Pulmonary/Chest: Effort normal. No respiratory distress. She has wheezes.       + wet, hacking cough  Lymphadenopathy:    She has no cervical adenopathy.  Skin: Skin is warm and dry. Rash (scabbed vesicular rash under arms bilaterally w/out surrounding erythema or induration) noted.          Assessment & Plan:

## 2012-03-30 NOTE — Assessment & Plan Note (Signed)
Unchanged.  Pt now w/ sxs bilaterally so this is not shingles.  Doxy will cover any type of folliculitis.  Reviewed supportive care and red flags that should prompt return.  Pt expressed understanding and is in agreement w/ plan.

## 2012-03-30 NOTE — Assessment & Plan Note (Signed)
New.  Pt's sxs and PE consistent w/ infxn and broncho spasm.  Switch abx to Doxy.  Start Symbicort to improve air movement and relief bronchospasm.  Reviewed supportive care and red flags that should prompt return.  Pt expressed understanding and is in agreement w/ plan.

## 2012-06-05 ENCOUNTER — Telehealth: Payer: Self-pay | Admitting: Family Medicine

## 2012-06-05 MED ORDER — CLOTRIMAZOLE-BETAMETHASONE 1-0.05 % EX CREA: TOPICAL_CREAM | Freq: Two times a day (BID) | CUTANEOUS | Status: AC

## 2012-06-05 NOTE — Telephone Encounter (Signed)
Called pt to clarify it has spread to both arms, advised MD Beverely Low will send her a cream to apply a thin layer, pt understood, MD Tabori sent medication via escribe

## 2012-06-05 NOTE — Telephone Encounter (Signed)
And please clarify if it is actually both arms- b/c if it's both arms, it's not shingles

## 2012-06-05 NOTE — Telephone Encounter (Signed)
Does she mean the Valtrex pills for shingles or the cream?

## 2012-06-05 NOTE — Telephone Encounter (Signed)
Caller: Monique Gross/Patient; PCP: Sheliah Hatch.; CB#: [phone]; Caller reports following diagnosis 01/2012, treated with ABX, cleared by May, new onset 06/02/12 of burning with rash, no blisters under both arms, where she uses deodorant and further down, afebrile, Pt is requesting RX again, instead of OV, please call.

## 2012-06-10 ENCOUNTER — Telehealth: Payer: Self-pay | Admitting: Family Medicine

## 2012-06-10 NOTE — Telephone Encounter (Signed)
Called pt home/mobile number however unable to reach pt per the VM box has not been set up yet, will try to call again later to advise MD Tabori instructions as follows:  I don't understand what about hauling a camper would require anxiety meds. These are controlled and this doesn't seem like a reason to prescribe

## 2012-06-10 NOTE — Telephone Encounter (Signed)
Patient states she and her husband are traveling through the mountains this weekend to [place]. Patient states she is very nervous about the trip because they will be hauling a large camper behind them. Pt would like a few "nerve pills" for the trip. Pt uses [facility] in [place].

## 2012-06-10 NOTE — Telephone Encounter (Signed)
I don't understand what about hauling a camper would require anxiety meds.  These are controlled and this doesn't seem like a reason to prescribe

## 2012-06-10 NOTE — Telephone Encounter (Signed)
Please advise 

## 2012-06-13 NOTE — Telephone Encounter (Signed)
Pt returned call to advise that the anxiety will cause her IBS/Shingles and migraine to flare up and she can not have diarrhea on this trip, scheduled pt for an apt 06-16-12 at 9:45am per MD Beverely Low, pt accepted apt

## 2012-06-16 ENCOUNTER — Encounter: Payer: Self-pay | Admitting: *Deleted

## 2012-06-16 ENCOUNTER — Encounter: Payer: Self-pay | Admitting: Family Medicine

## 2012-06-16 ENCOUNTER — Ambulatory Visit (INDEPENDENT_AMBULATORY_CARE_PROVIDER_SITE_OTHER): Payer: 59 | Admitting: Family Medicine

## 2012-06-16 VITALS — BP 122/78 | HR 76 | Temp 98.1°F | Ht 66.0 in | Wt 274.4 lb

## 2012-06-16 DIAGNOSIS — R21 Rash and other nonspecific skin eruption: Secondary | ICD-10-CM

## 2012-06-16 DIAGNOSIS — F341 Dysthymic disorder: Secondary | ICD-10-CM

## 2012-06-16 DIAGNOSIS — R1012 Left upper quadrant pain: Secondary | ICD-10-CM

## 2012-06-16 DIAGNOSIS — F419 Anxiety disorder, unspecified: Secondary | ICD-10-CM

## 2012-06-16 LAB — HEPATIC FUNCTION PANEL
AST: 19 U/L (ref 0–37)
Albumin: 3.7 g/dL (ref 3.5–5.2)
Bilirubin, Direct: 0 mg/dL (ref 0.0–0.3)
Total Bilirubin: 0.7 mg/dL (ref 0.3–1.2)

## 2012-06-16 LAB — BASIC METABOLIC PANEL
CO2: 27 mEq/L (ref 19–32)
Chloride: 104 mEq/L (ref 96–112)
Potassium: 3.9 mEq/L (ref 3.5–5.1)
Sodium: 138 mEq/L (ref 135–145)

## 2012-06-16 LAB — CBC WITH DIFFERENTIAL/PLATELET
Basophils Absolute: 0 10*3/uL (ref 0.0–0.1)
Eosinophils Absolute: 0.1 10*3/uL (ref 0.0–0.7)
Hemoglobin: 13.5 g/dL (ref 12.0–15.0)
Lymphocytes Relative: 23.8 % (ref 12.0–46.0)
MCHC: 33.4 g/dL (ref 30.0–36.0)
Monocytes Relative: 6.9 % (ref 3.0–12.0)
Neutrophils Relative %: 67.1 % (ref 43.0–77.0)
Platelets: 201 10*3/uL (ref 150.0–400.0)
RDW: 12.9 % (ref 11.5–14.6)

## 2012-06-16 MED ORDER — ALPRAZOLAM 0.5 MG PO TABS: 0.5000 mg | ORAL_TABLET | Freq: Three times a day (TID) | ORAL | Status: AC | PRN

## 2012-06-16 NOTE — Patient Instructions (Addendum)
Follow up as needed Use the xanax- 1/2 tab or 1 tab as needed for anxiety We'll call you with your Derm appt Use the Lotrisone cream as needed Start OTC Gas-X as needed for pain- call if not improving Call with any questions or concerns Have a safe trip!!!

## 2012-06-16 NOTE — Progress Notes (Signed)
  Subjective:    Patient ID: Monique Gross, female    DOB: 04-Nov-1971, 41 y.o.   MRN: [id number]  HPI Rash- occuring under arms bilaterally.  Improved w/ swimming at the beach but has since returned.  Thought it was shingles.  Gets rash under arms that extends along flanks, worsens w/ stress. Wants to see derm.  Anxiety- is driving 7 hrs to [place] and is 'petrified' of 'IBS acting up' w/out access to a bathroom.  Stress typically leads to diarrhea or migraines.  Asking for benzos to get through the trip.  L sided abdominal pain- intermittent, described as 'sharp, sudden cramping'.  No improvement w/ belching or farting.  No change w/ BMs.  Resolves spontaneously.  No N/V, constipation or diarrhea.  Will last 5 minutes.  3x in 2 weeks.  Typically occuring between 7-9 pm, about 2 hrs after eating.   Review of Systems For ROS see HPI     Objective:   Physical Exam  Vitals reviewed. Constitutional: She appears well-developed and well-nourished. No distress.  Cardiovascular: Normal rate, regular rhythm and normal heart sounds.   Pulmonary/Chest: Effort normal and breath sounds normal. No respiratory distress. She has no wheezes. She has no rales.  Abdominal: Soft. Bowel sounds are normal. She exhibits no distension. There is no tenderness. There is no rebound and no guarding.  Skin: Skin is warm and dry. Rash (almost vesicular appearing under arms bilaterally w/ extension onto flanks) noted.          Assessment & Plan:

## 2012-07-01 NOTE — Assessment & Plan Note (Signed)
Deteriorated.  Pt extremely nervous about travelling and fears her anxiety will exacerbate her IBS causing her to have 'uncontrollable diarrhea' when she won't have access to restrooms, which will then trigger stress migraine.  Script for xanax given to get her through her trip.

## 2012-07-01 NOTE — Assessment & Plan Note (Signed)
Recurrent.  Reassured pt that this is not shingles since it is occuring bilaterally.  ? Folliculitis.  Pt reports some improvement w/ past use of Lotrisone.  Refill given.  Refer to derm.  Pt expressed understanding and is in agreement w/ plan.

## 2012-07-01 NOTE — Assessment & Plan Note (Addendum)
New.  No abnormalities on PE.  Pt w/ hx of IBS.  Will get labs to r/o infxn or metabolic abnormality but find this unlikely.  Pt's sxs consistent w/ gas pain.  To try OTC gas-x and see if sxs improve.  Will follow.

## 2012-09-26 ENCOUNTER — Telehealth: Payer: Self-pay | Admitting: Gastroenterology

## 2012-09-26 NOTE — Telephone Encounter (Signed)
If she is still having problems with constipation and IBS try low dose Linzess or low dose Amitiza. She will need an REV after trying the new medication for a few weeks.

## 2012-09-26 NOTE — Telephone Encounter (Signed)
She is using levsin to the maximum that they are prescribed and it does help.  She is requesting a higher dose or an alternate rx.  Please advise

## 2012-09-26 NOTE — Telephone Encounter (Signed)
Left message for patient to call back  

## 2012-09-29 NOTE — Telephone Encounter (Signed)
Left message for patient to call back  

## 2012-09-30 NOTE — Telephone Encounter (Signed)
Left message for patient to call back  

## 2012-10-02 MED ORDER — LINACLOTIDE 145 MCG PO CAPS: 145.0000 ug | ORAL_CAPSULE | Freq: Every day | ORAL | Status: DC

## 2012-10-02 NOTE — Telephone Encounter (Signed)
Patient is having some constipation.  She does want to try linzess.  She is advised to try it when she is close to home for the first time, because she does report some occasional diarrhea also.  REV scheduled for 10/17/12

## 2012-10-16 ENCOUNTER — Encounter: Payer: Self-pay | Admitting: Internal Medicine

## 2012-10-17 ENCOUNTER — Ambulatory Visit: Payer: 59 | Admitting: Gastroenterology

## 2013-02-06 ENCOUNTER — Other Ambulatory Visit: Payer: Self-pay | Admitting: Family Medicine

## 2013-02-07 ENCOUNTER — Other Ambulatory Visit: Payer: Self-pay

## 2013-02-07 NOTE — Telephone Encounter (Signed)
Please advise on RE request.  Last OV:06-16-12.//AB/CMA

## 2013-02-09 ENCOUNTER — Other Ambulatory Visit: Payer: Self-pay | Admitting: Family Medicine

## 2013-02-09 DIAGNOSIS — F329 Major depressive disorder, single episode, unspecified: Secondary | ICD-10-CM

## 2013-03-11 ENCOUNTER — Encounter: Payer: Self-pay | Admitting: Family Medicine

## 2013-03-11 ENCOUNTER — Ambulatory Visit (INDEPENDENT_AMBULATORY_CARE_PROVIDER_SITE_OTHER): Payer: 59 | Admitting: Family Medicine

## 2013-03-11 VITALS — BP 110/80 | HR 80 | Temp 98.6°F | Ht 65.25 in | Wt 259.0 lb

## 2013-03-11 DIAGNOSIS — F341 Dysthymic disorder: Secondary | ICD-10-CM

## 2013-03-11 DIAGNOSIS — G43109 Migraine with aura, not intractable, without status migrainosus: Secondary | ICD-10-CM

## 2013-03-11 DIAGNOSIS — E039 Hypothyroidism, unspecified: Secondary | ICD-10-CM

## 2013-03-11 DIAGNOSIS — F419 Anxiety disorder, unspecified: Secondary | ICD-10-CM

## 2013-03-11 LAB — TSH: TSH: 2.1 u[IU]/mL (ref 0.35–5.50)

## 2013-03-11 MED ORDER — CITALOPRAM HYDROBROMIDE 20 MG PO TABS: ORAL_TABLET | ORAL | Status: DC

## 2013-03-11 MED ORDER — SUMATRIPTAN SUCCINATE 100 MG PO TABS: 100.0000 mg | ORAL_TABLET | ORAL | Status: DC | PRN

## 2013-03-11 MED ORDER — LEVOTHYROXINE SODIUM 100 MCG PO TABS: 100.0000 ug | ORAL_TABLET | Freq: Every day | ORAL | Status: DC

## 2013-03-11 NOTE — Progress Notes (Signed)
  Subjective:    Patient ID: Monique Gross, female    DOB: 03/24/1971, 42 y.o.   MRN: [id number]  HPI HA- 10 days ago had episode of blurry vision for 90 minutes 'like worms'.  1 week ago developed pain behind R eye, 'like a crunching pain- hard to open eye or look up'.  + photo and phonophobia.  No nausea.  Pain daily but will wax and wane.  Has tried Excedrin migraine w/out relief.  1/2 tab of vicodin improves pain.  Hx of migraines, this doesn't feel similar to previous.  Denies sinus pain/pressure.  No ear pain/dizziness.  Hypothyroid- has been out of meds x2 months.  Needs labs drawn.  Depression- needs refill on Celexa.   Review of Systems For ROS see HPI     Objective:   Physical Exam  Vitals reviewed. Constitutional: She is oriented to person, place, and time. She appears well-developed and well-nourished. No distress.  HENT:  Head: Normocephalic and atraumatic.  TMs WNL No TTP over sinuses Minimal nasal congestion  Eyes: Conjunctivae and EOM are normal. Pupils are equal, round, and reactive to light.  Neck: Normal range of motion. Neck supple. No thyromegaly present.  Cardiovascular: Normal rate, regular rhythm, normal heart sounds and intact distal pulses.   Pulmonary/Chest: Effort normal and breath sounds normal. No respiratory distress. She has no wheezes. She has no rales.  Lymphadenopathy:    She has no cervical adenopathy.  Neurological: She is alert and oriented to person, place, and time. She has normal reflexes. No cranial nerve deficit. Coordination normal.  Psychiatric: She has a normal mood and affect. Her behavior is normal. Judgment and thought content normal.          Assessment & Plan:

## 2013-03-11 NOTE — Patient Instructions (Addendum)
At the onset of headache, take 2 excedrin migraine and the Imitrex (start w/ 1/2 tab) If no improvement in the headache in 2 hrs, take the 2nd 1/2 tab (no more than 2 tabs in 24 hrs) Drink plenty of fluids We'll notify you of your lab results Restart the thyroid medication daily Schedule your complete physical at your convenience- we're overdue If the headache doesn't improve by Friday or worsens- please call and we'll proceed w/ the CT scan Hang in there!

## 2013-03-13 ENCOUNTER — Telehealth: Payer: Self-pay | Admitting: *Deleted

## 2013-03-13 DIAGNOSIS — G43109 Migraine with aura, not intractable, without status migrainosus: Secondary | ICD-10-CM

## 2013-03-13 NOTE — Telephone Encounter (Signed)
Patient left message on triage stating that the Imitrex prescribed at earlier visit is not helping her head pain. She wants to go ahead and schedule a CT as discussed with you.

## 2013-03-13 NOTE — Telephone Encounter (Signed)
CT scan ordered- will await results

## 2013-03-14 NOTE — Assessment & Plan Note (Signed)
Chronic problem.  Has been out of meds x2 months.  Check labs.  Restart meds.  Pt expressed understanding and is in agreement w/ plan.

## 2013-03-14 NOTE — Assessment & Plan Note (Signed)
Chronic problem.  Refill provided.

## 2013-03-14 NOTE — Assessment & Plan Note (Addendum)
New to provider.  Pt has hx of migraines but pt reports these are 'different'.  No red flags on PE.  Suspect migraine variant.  Will tx w/ NSAIDs, triptan.  Discussed CT today- pt prefers to hold off and see if tx improves her sxs.  Pt to call if sxs don't improve- will refer for imaging.  Reviewed supportive care and red flags that should prompt return.  Pt expressed understanding and is in agreement w/ plan.

## 2013-03-24 ENCOUNTER — Ambulatory Visit
Admission: RE | Admit: 2013-03-24 | Discharge: 2013-03-24 | Disposition: A | Discharge status: Home / Self Care | Payer: Managed Care, Other (non HMO) | Source: Ambulatory Visit | Attending: Family Medicine | Admitting: Family Medicine

## 2013-03-24 DIAGNOSIS — G43109 Migraine with aura, not intractable, without status migrainosus: Secondary | ICD-10-CM

## 2013-03-26 ENCOUNTER — Telehealth: Payer: Self-pay | Admitting: Family Medicine

## 2013-03-26 NOTE — Telephone Encounter (Signed)
Pt calling wanting to know if lab results have come in yet she can be reached at work on ext [phone]

## 2013-03-26 NOTE — Telephone Encounter (Signed)
Left message on VM for patient to return call when available, it appears patient's labs were sent through Mychart and she has viewed them:  Copy/paste from 03/11/13 labs  Entered by Sheliah Hatch, MD at 03/11/2013 8:20 PM Read by Leonarda Salon at 03/12/2013 4:05 PM Labs look good- i would still restart the thyroid med and we will repeat in 3 months

## 2013-03-27 ENCOUNTER — Telehealth: Payer: Self-pay | Admitting: *Deleted

## 2013-03-27 NOTE — Telephone Encounter (Signed)
Pt called wanting her CT Head results.  Informed the pt that her results was sent to her by MyChart.  Pt stated that she saw them.  Informed the pt that it was a normal head CT and that's good new.  Pt stated that the headaches are worse and she is unable to function with them.  Pt is wanting to know that since the CT was normal what is the next step.//AB/CMA

## 2013-03-27 NOTE — Telephone Encounter (Signed)
Needs neuro referral? 

## 2013-03-27 NOTE — Telephone Encounter (Signed)
Tried to call patient x 2, VM box not set up yet (unable to leave message)

## 2013-03-27 NOTE — Telephone Encounter (Signed)
Tried to call patient again, unable to leave message. WIll send patient a Mychart message for she has an Active status

## 2013-03-30 ENCOUNTER — Encounter: Payer: Self-pay | Admitting: Family Medicine

## 2013-03-31 NOTE — Telephone Encounter (Signed)
Pt agreed to Neuro referral and order was put in and sent.  Will let pt know by MyChart that she will hear back with an appt times.//AB/CMA

## 2013-04-02 ENCOUNTER — Encounter: Payer: Self-pay | Admitting: Family Medicine

## 2013-04-27 ENCOUNTER — Emergency Department (HOSPITAL_BASED_OUTPATIENT_CLINIC_OR_DEPARTMENT_OTHER)
Admission: EM | Admit: 2013-04-27 | Discharge: 2013-04-27 | Discharge status: Against Medical Advice | Payer: Managed Care, Other (non HMO) | Attending: Emergency Medicine | Admitting: Emergency Medicine

## 2013-04-27 ENCOUNTER — Encounter (HOSPITAL_BASED_OUTPATIENT_CLINIC_OR_DEPARTMENT_OTHER): Payer: Self-pay | Admitting: *Deleted

## 2013-04-27 ENCOUNTER — Emergency Department (HOSPITAL_BASED_OUTPATIENT_CLINIC_OR_DEPARTMENT_OTHER): Payer: Managed Care, Other (non HMO)

## 2013-04-27 DIAGNOSIS — Y9239 Other specified sports and athletic area as the place of occurrence of the external cause: Secondary | ICD-10-CM | POA: Insufficient documentation

## 2013-04-27 DIAGNOSIS — R209 Unspecified disturbances of skin sensation: Secondary | ICD-10-CM | POA: Insufficient documentation

## 2013-04-27 DIAGNOSIS — Y9389 Activity, other specified: Secondary | ICD-10-CM | POA: Insufficient documentation

## 2013-04-27 DIAGNOSIS — S0993XA Unspecified injury of face, initial encounter: Secondary | ICD-10-CM | POA: Insufficient documentation

## 2013-04-27 DIAGNOSIS — I120 Hypertensive chronic kidney disease with stage 5 chronic kidney disease or end stage renal disease: Secondary | ICD-10-CM | POA: Insufficient documentation

## 2013-04-27 DIAGNOSIS — Y92838 Other recreation area as the place of occurrence of the external cause: Secondary | ICD-10-CM | POA: Insufficient documentation

## 2013-04-27 DIAGNOSIS — Z79899 Other long term (current) drug therapy: Secondary | ICD-10-CM | POA: Insufficient documentation

## 2013-04-27 DIAGNOSIS — X58XXXA Exposure to other specified factors, initial encounter: Secondary | ICD-10-CM | POA: Insufficient documentation

## 2013-04-27 DIAGNOSIS — Z9889 Other specified postprocedural states: Secondary | ICD-10-CM | POA: Insufficient documentation

## 2013-04-27 DIAGNOSIS — N189 Chronic kidney disease, unspecified: Secondary | ICD-10-CM | POA: Insufficient documentation

## 2013-04-27 DIAGNOSIS — S199XXA Unspecified injury of neck, initial encounter: Secondary | ICD-10-CM | POA: Insufficient documentation

## 2013-04-27 NOTE — ED Notes (Signed)
Neck injury a few days ago while at the beach. Left arm is numb. Hx back surgery 4 years ago for herniated disc. Was seen at [facility] and told to come here.

## 2013-04-27 NOTE — ED Notes (Signed)
Pt stated to xray tech she wanted an MRI on neck not xrays. Pt was informed by radiology staff MRI not available at this time. Pt chose to leave without being seen.

## 2013-05-01 ENCOUNTER — Other Ambulatory Visit: Payer: Self-pay | Admitting: Neurosurgery

## 2013-05-01 ENCOUNTER — Ambulatory Visit
Admission: RE | Admit: 2013-05-01 | Discharge: 2013-05-01 | Disposition: A | Discharge status: Home / Self Care | Payer: Managed Care, Other (non HMO) | Source: Ambulatory Visit | Attending: Neurosurgery | Admitting: Neurosurgery

## 2013-05-01 DIAGNOSIS — M542 Cervicalgia: Secondary | ICD-10-CM

## 2013-05-03 ENCOUNTER — Other Ambulatory Visit: Payer: Managed Care, Other (non HMO)

## 2013-07-22 DIAGNOSIS — M542 Cervicalgia: Secondary | ICD-10-CM | POA: Insufficient documentation

## 2013-09-26 LAB — HM PAP SMEAR: HM PAP: NORMAL

## 2013-09-26 LAB — HM MAMMOGRAPHY

## 2013-10-06 ENCOUNTER — Encounter: Payer: Self-pay | Admitting: Family Medicine

## 2013-11-25 ENCOUNTER — Other Ambulatory Visit: Payer: Self-pay | Admitting: Obstetrics & Gynecology

## 2014-01-18 ENCOUNTER — Ambulatory Visit (INDEPENDENT_AMBULATORY_CARE_PROVIDER_SITE_OTHER): Payer: Managed Care, Other (non HMO) | Admitting: Family Medicine

## 2014-01-18 ENCOUNTER — Encounter: Payer: Self-pay | Admitting: Family Medicine

## 2014-01-18 VITALS — BP 128/80 | HR 78 | Temp 98.2°F | Resp 16 | Wt 276.4 lb

## 2014-01-18 DIAGNOSIS — F329 Major depressive disorder, single episode, unspecified: Secondary | ICD-10-CM

## 2014-01-18 DIAGNOSIS — F32A Depression, unspecified: Secondary | ICD-10-CM

## 2014-01-18 DIAGNOSIS — F419 Anxiety disorder, unspecified: Principal | ICD-10-CM

## 2014-01-18 DIAGNOSIS — F341 Dysthymic disorder: Secondary | ICD-10-CM

## 2014-01-18 DIAGNOSIS — F17200 Nicotine dependence, unspecified, uncomplicated: Secondary | ICD-10-CM | POA: Insufficient documentation

## 2014-01-18 DIAGNOSIS — R918 Other nonspecific abnormal finding of lung field: Secondary | ICD-10-CM | POA: Insufficient documentation

## 2014-01-18 MED ORDER — VARENICLINE TARTRATE 1 MG PO TABS: 1.0000 mg | ORAL_TABLET | Freq: Two times a day (BID) | ORAL | Status: DC

## 2014-01-18 MED ORDER — CITALOPRAM HYDROBROMIDE 20 MG PO TABS: ORAL_TABLET | ORAL | Status: DC

## 2014-01-18 MED ORDER — VARENICLINE TARTRATE 0.5 MG X 11 & 1 MG X 42 PO MISC: ORAL | Status: DC

## 2014-01-18 NOTE — Assessment & Plan Note (Signed)
New.  Reviewed CT scan.  Has 1 on R and 1 on L.  Send to [facility] for evaluation and tx.

## 2014-01-18 NOTE — Assessment & Plan Note (Signed)
Chronic problem.  Interested in trying to quit- has been successful w/ Chantix in the past.  Scripts given.

## 2014-01-18 NOTE — Progress Notes (Signed)
   Subjective:    Patient ID: Monique Gross, female    DOB: 03/18/1971, 43 y.o.   MRN: [id number]  HPI Pulmonary nodules- pt had CT scan done by urology on 1/21 in the setting of hematuria and hx of stones.  Coincidentally noted on CT, bilateral lower lobe pulmonary nodules, largest measuring 32mm.  Pt does have hx of smoking.  No family hx of lung cancer.  Has not lived in the [place] or [place].  No fevers, night sweats, weight loss.  Tobacco use- wants to restart Chantix.   Review of Systems For ROS see HPI     Objective:   Physical Exam  Vitals reviewed. Constitutional: She appears well-developed and well-nourished. No distress.  Cardiovascular: Normal rate, regular rhythm and normal heart sounds.   Pulmonary/Chest: Effort normal and breath sounds normal. No respiratory distress. She has no wheezes. She has no rales.          Assessment & Plan:

## 2014-01-18 NOTE — Patient Instructions (Signed)
Schedule your complete physical in 3-4 months We'll call you with your pulmonary appt Cancel your upcoming CT scan- pulm will set this up for you Start the Chantix- you can totally do this!!! Call with any questions or concerns Hang in there!

## 2014-01-18 NOTE — Progress Notes (Signed)
Pre visit review using our clinic review tool, if applicable. No additional management support is needed unless otherwise documented below in the visit note. 

## 2014-01-18 NOTE — Assessment & Plan Note (Signed)
Refill provided on Celexa 

## 2014-01-22 ENCOUNTER — Encounter: Payer: Self-pay | Admitting: Internal Medicine

## 2014-01-22 ENCOUNTER — Telehealth: Payer: Self-pay

## 2014-01-22 ENCOUNTER — Ambulatory Visit (INDEPENDENT_AMBULATORY_CARE_PROVIDER_SITE_OTHER): Payer: Managed Care, Other (non HMO) | Admitting: Internal Medicine

## 2014-01-22 VITALS — BP 124/82 | HR 77 | Ht 66.0 in | Wt 277.4 lb

## 2014-01-22 DIAGNOSIS — R911 Solitary pulmonary nodule: Secondary | ICD-10-CM

## 2014-01-22 MED ORDER — LINACLOTIDE 145 MCG PO CAPS: 145.0000 ug | ORAL_CAPSULE | Freq: Every day | ORAL | Status: DC

## 2014-01-22 NOTE — Patient Instructions (Signed)
DO CT scan of chest for lung nodule - left lower lobe  Will call with results to decide next step

## 2014-01-22 NOTE — Telephone Encounter (Signed)
[place] for samples if available

## 2014-01-22 NOTE — Telephone Encounter (Signed)
The patient called and is hoping to get samples or an rx for [employer] [phone]  (i apologize for not forwarding this message to [facility].  I already had started taking down the information when I realized the patient is a Dr.Tabori pt)

## 2014-01-22 NOTE — Progress Notes (Signed)
Subjective:    Patient ID: Monique Gross, female    DOB: 08-16-1971, 43 y.o.   MRN: [id number] PCP Annye Asa, MD   HPI  IOV 01/22/2014  Chief Complaint  Patient presents with  . Pulmonary Consult    for abnormal CT scan.     43 year old female. Smoker. History of colonic polyps that according to her "precancerous" . Shows a history of chronic nephrolithiasis. She underwent a CT scan of the abdomen within the past week at [facility]. Lungs imaging of that CT scan of the abdomen showed 7 mm left lower lobe nodule and there for she's been referred. She's asymptomatic from a respiratory standpoint. There is no associated dyspnea, cough, wheezing, chest pain. She is extremely worried about lung cancer given her above history. She also says that she is the type of person who gets diseases that are against the odds, eg: she has had "19 wisdom teeth".    Past Medical History  Diagnosis Date  . HTN (hypertension)   . Urolithiasis   . Lumbar disc disease   . OSA (obstructive sleep apnea)   . Chronic kidney disease     hx of kidney stones  . Diverticulosis   . Migraines   . IBS (irritable bowel syndrome)   . Tubular adenoma of colon 12/2011  . Diverticulosis      Family History  Problem Relation Age of Onset  . Urolithiasis Father   . Kidney disease Father     renal stones  . Thyroid disease Mother   . Thyroid disease Maternal Grandmother   . Diabetes Maternal Grandfather      History   Social History  . Marital Status: Married    Spouse Name: N/A    Number of Children: 1  . Years of Education: N/A   Occupational History  . purchasing    Social History Main Topics  . Smoking status: Current Every Day Smoker -- 0.50 packs/day for 11 years    Types: Cigarettes  . Smokeless tobacco: Never Used  . Alcohol Use: No  . Drug Use: No  . Sexual Activity: Not on file   Other Topics Concern  . Not on file   Social History Narrative  . No narrative on file      No Known Allergies   Outpatient Prescriptions Prior to Visit  Medication Sig Dispense Refill  . citalopram (CELEXA) 20 MG tablet TAKE ONE TABLET BY MOUTH EVERY DAY  30 tablet  6  . HYDROcodone-acetaminophen (VICODIN) 5-500 MG per tablet Take 1 tablet by mouth every 6 (six) hours as needed.        . polyethylene glycol (MIRALAX / GLYCOLAX) packet Take 17 g by mouth daily.        . varenicline (CHANTIX STARTING MONTH PAK) 0.5 MG X 11 & 1 MG X 42 tablet 0.5 mg tablet by mouth once daily for 3 days, then 0.5 mg tablet twice daily for 4 days, then 1 mg tablet twice daily.  53 tablet  0  . varenicline (CHANTIX CONTINUING MONTH PAK) 1 MG tablet Take 1 tablet (1 mg total) by mouth 2 (two) times daily.  60 tablet  3  . levothyroxine (SYNTHROID, LEVOTHROID) 100 MCG tablet Take 1 tablet (100 mcg total) by mouth daily.  30 tablet  6   No facility-administered medications prior to visit.       Review of Systems  Constitutional: Negative for fever and unexpected weight change.  HENT: Negative for congestion, dental  problem, ear pain, nosebleeds, postnasal drip, rhinorrhea, sinus pressure, sneezing, sore throat and trouble swallowing.   Eyes: Negative for redness and itching.  Respiratory: Negative for cough, chest tightness, shortness of breath and wheezing.   Cardiovascular: Negative for palpitations and leg swelling.  Gastrointestinal: Negative for nausea and vomiting.  Genitourinary: Negative for dysuria.  Musculoskeletal: Negative for joint swelling.  Skin: Negative for rash.  Neurological: Positive for headaches.  Hematological: Does not bruise/bleed easily.  Psychiatric/Behavioral: Negative for dysphoric mood. The patient is not nervous/anxious.        Objective:   Physical Exam  Vitals reviewed. Constitutional: She is oriented to person, place, and time. She appears well-developed and well-nourished. No distress.  Body mass index is 44.79 kg/(m^2).   HENT:  Head: Normocephalic  and atraumatic.  Right Ear: External ear normal.  Left Ear: External ear normal.  Mouth/Throat: Oropharynx is clear and moist. No oropharyngeal exudate.  Eyes: Conjunctivae and EOM are normal. Pupils are equal, round, and reactive to light. Right eye exhibits no discharge. Left eye exhibits no discharge. No scleral icterus.  Neck: Normal range of motion. Neck supple. No JVD present. No tracheal deviation present. No thyromegaly present.  Cardiovascular: Normal rate, regular rhythm, normal heart sounds and intact distal pulses.  Exam reveals no gallop and no friction rub.   No murmur heard. Pulmonary/Chest: Effort normal and breath sounds normal. No respiratory distress. She has no wheezes. She has no rales. She exhibits no tenderness.  Abdominal: Soft. Bowel sounds are normal. She exhibits no distension and no mass. There is no tenderness. There is no rebound and no guarding.  Musculoskeletal: Normal range of motion. She exhibits no edema and no tenderness.  Lymphadenopathy:    She has no cervical adenopathy.  Neurological: She is alert and oriented to person, place, and time. She has normal reflexes. No cranial nerve deficit. She exhibits normal muscle tone. Coordination normal.  Skin: Skin is warm and dry. No rash noted. She is not diaphoretic. No erythema. No pallor.  Psychiatric: She has a normal mood and affect. Her behavior is normal. Judgment and thought content normal.          Assessment & Plan:

## 2014-01-22 NOTE — Telephone Encounter (Signed)
Pt notified samples at front desk and rx sent.

## 2014-01-24 DIAGNOSIS — R911 Solitary pulmonary nodule: Secondary | ICD-10-CM | POA: Insufficient documentation

## 2014-01-24 NOTE — Assessment & Plan Note (Addendum)
46mm nodule LLL seen in Abd CT lung cut (personally reviewed). We do not know true size of this nodule because entire lung is not imaged. We do not know presence/absence of nodules in upper zones. Given smoking hx, despite her young age, get CT Chest wo contrast now FU to be decided based on that Ct. She is agreeable and satisfied with the plan.   Ovreall low-intermed probability for lung cancer which requires CT chest now.

## 2014-01-29 ENCOUNTER — Ambulatory Visit (INDEPENDENT_AMBULATORY_CARE_PROVIDER_SITE_OTHER)
Admission: RE | Admit: 2014-01-29 | Discharge: 2014-01-29 | Disposition: A | Discharge status: Home / Self Care | Payer: Managed Care, Other (non HMO) | Source: Ambulatory Visit | Attending: Internal Medicine | Admitting: Internal Medicine

## 2014-01-29 DIAGNOSIS — R911 Solitary pulmonary nodule: Secondary | ICD-10-CM

## 2014-02-03 ENCOUNTER — Telehealth: Payer: Self-pay | Admitting: Internal Medicine

## 2014-02-03 NOTE — Telephone Encounter (Signed)
Called spoke with pt. She is requesting CT results. Please advise MR thanks

## 2014-02-04 NOTE — Telephone Encounter (Signed)
Spoke with pt and advised of CT results per Dr Chase Caller.  She verbalized understanding.  Pt will call us back around 5 months to get CT scheduled at 6 months.

## 2014-02-04 NOTE — Telephone Encounter (Signed)
Sorry for delay. Swamped  Ct chest shows largestt nodule is 45mm in left lower lobe. There are other nodules that are there in same area but smaller. Otherwise normal Ct. Low-Intermediate prob for cancer; at this point no test or biopsy available to figure what the problems is. REcommended repeat CT in 9 months for non0smoker but due to her concerns we can get it sooner in 6 months wo contrast.  FU with me in 6 months after that Ct  Dr. Brand Males, M.D., [facility].C.P Pulmonary and Critical Care Medicine Staff Physician [facility] Pager: [phone], If no answer or between  15:00h - 7:00h: call [phone]  02/04/2014 9:02 AM

## 2014-02-25 ENCOUNTER — Other Ambulatory Visit: Payer: Self-pay | Admitting: Urology

## 2014-02-26 ENCOUNTER — Encounter (HOSPITAL_COMMUNITY): Payer: Self-pay | Admitting: Pharmacy Technician

## 2014-02-26 NOTE — Progress Notes (Signed)
Spoke to patient via phone,history obtained,updated.  Bring blue folder,insurance cards,picture ID,designated driver and living will,POA, if desires (to be placed on chart). Reinforced no aspirin(instructions to hold aspirin per your doctor), ibuprofen products 72 hours prior to procedure. No vitamins or herbal medicines 7 days prior to procedure.   Follow laxative instructions provided by urologist (office) and in blue folder pt states blur folder is being mailed to her.. Wear easy on/off clothing and no jewelry except wedding rings and ear rings. Leave all other valuables at home. Verbalizes understanding of instructions  No alcohol 24 hours prior to procedure.call md office if you have a cold,sorethroat or fever. Shower or bathe before your treatment. You may have clear liquids up to 4 hours prior to treatment,NO SOLIDS 6 hours prior to treatment. If you wear a pain patch,please remove it prior to treatment and do not reapply for 24 hours. Pt has a history of Sleep Apnea but does not wear a C-PAP

## 2014-03-04 ENCOUNTER — Encounter (HOSPITAL_COMMUNITY): Payer: Self-pay | Admitting: *Deleted

## 2014-03-04 ENCOUNTER — Ambulatory Visit (HOSPITAL_COMMUNITY): Payer: PRIVATE HEALTH INSURANCE

## 2014-03-04 ENCOUNTER — Encounter (HOSPITAL_COMMUNITY)
Admission: RE | Disposition: A | Discharge status: Home / Self Care | Payer: Self-pay | Source: Ambulatory Visit | Attending: Urology

## 2014-03-04 ENCOUNTER — Ambulatory Visit (HOSPITAL_COMMUNITY)
Admission: RE | Admit: 2014-03-04 | Discharge: 2014-03-04 | Disposition: A | Discharge status: Home / Self Care | Payer: PRIVATE HEALTH INSURANCE | Source: Ambulatory Visit | Attending: Urology | Admitting: Urology

## 2014-03-04 DIAGNOSIS — G4730 Sleep apnea, unspecified: Secondary | ICD-10-CM | POA: Insufficient documentation

## 2014-03-04 DIAGNOSIS — Z79899 Other long term (current) drug therapy: Secondary | ICD-10-CM | POA: Insufficient documentation

## 2014-03-04 DIAGNOSIS — Z90710 Acquired absence of both cervix and uterus: Secondary | ICD-10-CM | POA: Insufficient documentation

## 2014-03-04 DIAGNOSIS — N200 Calculus of kidney: Secondary | ICD-10-CM | POA: Insufficient documentation

## 2014-03-04 DIAGNOSIS — R918 Other nonspecific abnormal finding of lung field: Secondary | ICD-10-CM | POA: Insufficient documentation

## 2014-03-04 DIAGNOSIS — N201 Calculus of ureter: Secondary | ICD-10-CM | POA: Insufficient documentation

## 2014-03-04 DIAGNOSIS — Z9089 Acquired absence of other organs: Secondary | ICD-10-CM | POA: Insufficient documentation

## 2014-03-04 DIAGNOSIS — Z87891 Personal history of nicotine dependence: Secondary | ICD-10-CM | POA: Insufficient documentation

## 2014-03-04 SURGERY — LITHOTRIPSY, ESWL
Anesthesia: LOCAL | Laterality: Left

## 2014-03-04 MED ORDER — CIPROFLOXACIN HCL 500 MG PO TABS
500.0000 mg | ORAL_TABLET | ORAL | Status: AC
  Administered 2014-03-04: 500 mg via ORAL
  Filled 2014-03-04: qty 1

## 2014-03-04 MED ORDER — HYDROCODONE-ACETAMINOPHEN 5-325 MG PO TABS: 1.0000 | ORAL_TABLET | Freq: Four times a day (QID) | ORAL | Status: DC | PRN

## 2014-03-04 MED ORDER — DEXTROSE-NACL 5-0.45 % IV SOLN
INTRAVENOUS | Status: DC
  Administered 2014-03-04: 15:00:00 via INTRAVENOUS

## 2014-03-04 MED ORDER — HYDROCODONE-ACETAMINOPHEN 5-325 MG PO TABS
1.0000 | ORAL_TABLET | Freq: Four times a day (QID) | ORAL | Status: DC | PRN
  Administered 2014-03-04: 1 via ORAL
  Filled 2014-03-04: qty 1

## 2014-03-04 MED ORDER — DIAZEPAM 5 MG PO TABS
10.0000 mg | ORAL_TABLET | ORAL | Status: AC
  Administered 2014-03-04: 10 mg via ORAL
  Filled 2014-03-04: qty 2

## 2014-03-04 MED ORDER — DIPHENHYDRAMINE HCL 25 MG PO CAPS
25.0000 mg | ORAL_CAPSULE | ORAL | Status: AC
  Administered 2014-03-04: 25 mg via ORAL
  Filled 2014-03-04: qty 1

## 2014-03-04 NOTE — Discharge Instructions (Signed)
See [facility] discharge instructions in chart.  

## 2014-03-04 NOTE — H&P (Signed)
History of Present Illness   [name] returns today for followup. She was seen here in 12/2013, primarily with asymptomatic gross hematuria. She has a history of nephrolithiasis and has passed stones spontaneously. She has also had at least 1 lithotripsy and a ureteroscopic procedure. She has had calcium oxalate stone disease noted in the past. She has continued to have some intermittent hematuria. She also more recently developed some very mild discomfort in the left flank and some ongoing nausea. On recent CT imaging, she was felt to have a 3-4 mm nonobstructing stone in her left renal pelvis/UPJ region. There was also some small bilateral renal calculi. Incidentally she was noted to have some pulmonary nodules. She subsequently has sought expert opinion and while they suspect that these are benign, a pulmonary dedicated CT has been ordered for tomorrow.     2 weeks ago she appeared to have a 5 mm x 7 mm actually in her left proximal ureter UPJ region. It appears to be seen on KUB as well, although stone is somewhat faint. She does continue to have a moderate degree of microhematuria.     She continues to have some symptoms but they're relatively vague. She brings with her several small stones/fragments of stones. Not enough to account for the stone originally diagnosed.       Past Medical History Problems  1. History of kidney stones (V13.01)  Surgical History Problems  1. History of Back Surgery 2. History of Cholecystectomy 3. History of Cystoscopy With Manipulation Of Ureteral Calculus 4. History of Hysterectomy 5. History of Lithotripsy 6. History of Uterine Myomectomy  Current Meds 1. CeleXA 20 MG Oral Tablet;  Therapy: (Recorded:20Jan2015) to Recorded 2. Ondansetron HCl - 4 MG Oral Tablet; Take 1 tablet every 6 hours;  Therapy: [id number] to (Last Rx:05Feb2015)  Requested for: [id number] Ordered 3. Oxycodone-Acetaminophen 7.5-325 MG Oral Tablet; TAKE 1 TABLET EVERY 4 TO  6  HOURS AS NEEDED FOR PAIN;  Therapy: 20Jan2015 to (Evaluate:27Jan2015); Last Rx:20Jan2015 Ordered 4. Tamsulosin HCl - 0.4 MG Oral Capsule; TAKE 1 CAPSULE BY MOUTH  DAILY;  Therapy: 20Jan2015 to (Last Rx:20Jan2015)  Requested for: 20Jan2015 Ordered  Allergies Medication  1. No Known Drug Allergies  Family History Problems  1. Family history of Family Health Status - Father's Age   58 2. Family history of Family Health Status - Mother's Age   65 3. Family history of Nephrolithiasis : Father  Social History Problems  1. Denied: History of Alcohol Use 2. Caffeine Use   1 per day 3. Caffeine Use   1 per day 4. Former Smoker   1 ppd x 75yrs, quit 49yr ago 5. Marital History - Currently Married 6. Marital History - Currently Married 7. Occupation:   Geologist, engineering 8. Occupation:   Geologist, engineering  Review of Systems Genitourinary, constitutional, skin, eye, otolaryngeal, hematologic/lymphatic, cardiovascular, pulmonary, endocrine, musculoskeletal, gastrointestinal, neurological and psychiatric system(s) were reviewed and pertinent findings if present are noted.  Genitourinary: no hematuria.  Gastrointestinal: abdominal pain.  Musculoskeletal: back pain.    Vitals Vital Signs [Data Includes: Last 1 Day]  Recorded: [id number] 11:24AM  Blood Pressure: 132 / 84 Temperature: 99 F Heart Rate: 72  Physical Exam Constitutional: Well nourished and well developed . No acute distress.  Pulmonary: No respiratory distress and normal respiratory rhythm and effort.  Cardiovascular: Heart rate and rhythm are normal . No peripheral edema.  Abdomen: The abdomen is soft and nontender. No masses are palpated. No CVA tenderness. No hernias are palpable. No  hepatosplenomegaly noted.  Skin: Normal skin turgor, no visible rash and no visible skin lesions.  Neuro/Psych:. Mood and affect are appropriate.    Results/Data Urine [Data Includes: Last 1 Day]   [id number]  COLOR YELLOW   APPEARANCE  CLEAR   SPECIFIC GRAVITY 1.025   pH 5.5   GLUCOSE NEG mg/dL  BILIRUBIN NEG   KETONE NEG mg/dL  BLOOD SMALL   PROTEIN NEG mg/dL  UROBILINOGEN 0.2 mg/dL  NITRITE NEG   LEUKOCYTE ESTERASE TRACE   SQUAMOUS EPITHELIAL/HPF MODERATE   WBC 0-2 WBC/hpf  RBC NONE SEEN RBC/hpf  BACTERIA NONE SEEN   CRYSTALS NONE SEEN   CASTS NONE SEEN    KUB was repeated today. The patient currently has a linear calcification measuring approximately 4 x 7 mm now in the area of the left renal pelvis. It appears that the stone has indeed migrated somewhat proximally. It had previously been in the proximal ureter. Upon reviewing her old KUB and CT scan and this does appear to be the same calculus.   Assessment Assessed  1. Calculus of left ureter (592.1) 2. Nephrolithiasis (592.0)  Plan Health Maintenance  1. UA With REFLEX; [Do Not Release]; Status:Complete;   Done: [id number] 10:43AM Nephrolithiasis  2. Follow-up Schedule Surgery Office  Follow-up  Status: Complete  Done: [id number]  Discussion/Summary Left renal calculus approximately 4 x 7 mm. She previously was symptomatic with some gross hematuria. The stone now in city [place] renal pelvis. I do think the stone has a high likelihood of continuing to at least be intermittently clinically significant. I do suggest definitive therapy and given the current appearance and location stone I do feel it is fairly ideally suited for ESWL. The patient is having a procedure in the past. Full risks and benefits discussed with her. This will be scheduled for sometime in the next few weeks on a Thursday at her request   Signatures Electronically signed by : Rana Snare, M.D.; Feb 25 2014  7:54AM EST

## 2014-03-04 NOTE — Op Note (Signed)
See [facility] note scanned into chart. 

## 2014-03-04 NOTE — Interval H&P Note (Signed)
History and Physical Interval Note:  03/04/2014 5:10 PM  Monique Gross  has presented today for surgery, with the diagnosis of LEFT RENAL STONE  The various methods of treatment have been discussed with the patient and family. After consideration of risks, benefits and other options for treatment, the patient has consented to  Procedure(s): LEFT EXTRACORPOREAL SHOCK WAVE LITHOTRIPSY (ESWL) (Left) as a surgical intervention .  The patient's history has been reviewed, patient examined, no change in status, stable for surgery.  I have reviewed the patient's chart and labs.  Questions were answered to the patient's satisfaction.     [name]

## 2014-04-19 ENCOUNTER — Telehealth: Payer: Self-pay

## 2014-04-19 NOTE — Telephone Encounter (Signed)
Left message for call back Non-identifiable   Pap- 04/24/11; 10/18/08-normal; hx. Hysterectomy [facility]- 01/01/12- adenomatous polyps, moderate diverticulosis; repeat in 5 years (04/2017) [facility]-   Flu- 09/26/13 Td- 07/23/06

## 2014-04-20 ENCOUNTER — Encounter: Payer: PRIVATE HEALTH INSURANCE | Admitting: Family Medicine

## 2014-04-20 DIAGNOSIS — Z0289 Encounter for other administrative examinations: Secondary | ICD-10-CM

## 2014-04-26 ENCOUNTER — Ambulatory Visit (INDEPENDENT_AMBULATORY_CARE_PROVIDER_SITE_OTHER): Payer: PRIVATE HEALTH INSURANCE | Admitting: Internal Medicine

## 2014-04-26 ENCOUNTER — Encounter: Payer: Self-pay | Admitting: Internal Medicine

## 2014-04-26 VITALS — BP 138/81 | HR 79 | Temp 97.8°F | Wt 280.0 lb

## 2014-04-26 DIAGNOSIS — R110 Nausea: Secondary | ICD-10-CM

## 2014-04-26 MED ORDER — PROMETHAZINE HCL 25 MG PO TABS: 25.0000 mg | ORAL_TABLET | Freq: Four times a day (QID) | ORAL | Status: DC | PRN

## 2014-04-26 NOTE — Patient Instructions (Signed)
Take Phenergan as needed for nausea, will cause drowsiness  Drink plenty of fluids  If you are not gradually improving in the next few days please let us know.  Call anytime if severe symptoms, fever, chills or abdominal pain

## 2014-04-26 NOTE — Telephone Encounter (Signed)
No show

## 2014-04-26 NOTE — Progress Notes (Signed)
Subjective:    Patient ID: Monique Gross, female    DOB: October 05, 1971, 43 y.o.   MRN: [id number]  DOS:  04/26/2014 Type of  visit:  Acute visit A week ago developed severe N-V, sx lasted 1 day, since then has "waves " of nausea, every 2 hours, not triggered necessarily by food but is trying to follow a bland diet .    ROS Denies fever or chills Denies heartburn, abdominal pain; no recent intake of Motrin. Denies dysuria, gross hematuria, difficulty urinating or flank pain. No headaches or dizziness. No constipation.   Past Medical History  Diagnosis Date  . HTN (hypertension)   . Urolithiasis   . Lumbar disc disease   . OSA (obstructive sleep apnea)   . Chronic kidney disease     hx of kidney stones  . Diverticulosis   . Migraines   . IBS (irritable bowel syndrome)   . Tubular adenoma of colon 12/2011  . Diverticulosis     Past Surgical History  Procedure Laterality Date  . Cesarean section    . Lumbar disc surgery  11-2008  . Tubal ligation  06/2011  . Lithotripsy    . Back surgery    . Partial hysterectomy      History   Social History  . Marital Status: Married    Spouse Name: N/A    Number of Children: 1  . Years of Education: N/A   Occupational History  . purchasing    Social History Main Topics  . Smoking status: Current Every Day Smoker -- 0.50 packs/day for 11 years    Types: Cigarettes  . Smokeless tobacco: Never Used  . Alcohol Use: No  . Drug Use: No  . Sexual Activity: Not on file   Other Topics Concern  . Not on file   Social History Narrative  . No narrative on file        Medication List       This list is accurate as of: 04/26/14  8:56 PM.  Always use your most recent med list.               citalopram 20 MG tablet  Commonly known as:  CELEXA  Take 20 mg by mouth every morning.     HYDROcodone-acetaminophen 5-325 MG per tablet  Commonly known as:  NORCO/VICODIN  Take 1-2 tablets by mouth every 6 (six) hours as needed.      LINZESS 145 MCG Caps capsule  Generic drug:  Linaclotide  Take 145 mcg by mouth daily as needed (constipation).     polyethylene glycol packet  Commonly known as:  MIRALAX / GLYCOLAX  Take 17 g by mouth daily.     promethazine 25 MG tablet  Commonly known as:  PHENERGAN  Take 1 tablet (25 mg total) by mouth every 6 (six) hours as needed for nausea or vomiting.           Objective:   Physical Exam BP 138/81  Pulse 79  Temp(Src) 97.8 F (36.6 C)  Wt 280 lb (127.007 kg)  SpO2 95%  LMP 02/06/2013 General -- alert, well-developed, NAD.  HEENT-- Not pale or jaundice Lungs -- normal respiratory effort, no intercostal retractions, no accessory muscle use, and normal breath sounds.  Heart-- normal rate, regular rhythm, no murmur.  Abdomen-- Not distended, good bowel sounds,soft, non-tender. No rebound or rigidity. No mass,organomegaly. Extremities-- no pretibial edema bilaterally  Neurologic--  alert & oriented X3. Speech normal, gait normal, strength normal in  all extremities.  Psych-- Cognition and judgment appear intact. Cooperative with normal attention span and concentration. No anxious or depressed appearing.        Assessment & Plan:    Nausea, 43 year old lady with a history of kidney stones,status post total hysterectomy presents with nausea alone. No abdominal pain or diarrhea. Her husband  had similar symptoms 2 weeks prior by her. She suspectsa virus and it could be  although is uncommon not to have diarrhea. Plan:  Treat symptomatically with Phenergan Rest, fluids. Is not improving she will call within 2 days, will schedule a abdominal ultrasound to rule out gallbladder disease

## 2014-04-26 NOTE — Progress Notes (Signed)
Pre visit review using our clinic review tool, if applicable. No additional management support is needed unless otherwise documented below in the visit note. 

## 2014-04-27 ENCOUNTER — Telehealth: Payer: Self-pay | Admitting: Family Medicine

## 2014-04-27 NOTE — Telephone Encounter (Signed)
Relevant patient education assigned to patient using Emmi. ° °

## 2014-06-18 ENCOUNTER — Telehealth: Payer: Self-pay | Admitting: Gastroenterology

## 2014-06-18 NOTE — Telephone Encounter (Signed)
Yes, increase Linzess to 290 mcg daily

## 2014-06-18 NOTE — Telephone Encounter (Signed)
Spoke with pt and she is aware.

## 2014-06-18 NOTE — Telephone Encounter (Signed)
Pt states she is having constipation issues again, states she is going 6-7 days without having a bowel movement again. States she is taking miralax daily and that she took a linzess 146mcg and it didn't work either. Dr. Fuller Plan can pt double up on Linzess and take 217mcg? Please advise.

## 2015-02-28 ENCOUNTER — Telehealth: Payer: Self-pay | Admitting: General Practice

## 2015-02-28 MED ORDER — CITALOPRAM HYDROBROMIDE 20 MG PO TABS
20.0000 mg | ORAL_TABLET | Freq: Every morning | ORAL | Status: DC
Start: 2015-02-28 — End: 2015-03-29

## 2015-02-28 NOTE — Telephone Encounter (Signed)
[facility] for #30, no refills.  Pt due for appt

## 2015-02-28 NOTE — Telephone Encounter (Signed)
Med filled and letter mailed to pt.  

## 2015-02-28 NOTE — Telephone Encounter (Signed)
Last OV 01-18-14, no upcoming appts Citalopram last filled 01-18-14 #30 with 6

## 2015-03-29 ENCOUNTER — Encounter: Payer: Self-pay | Admitting: Medical

## 2015-03-29 ENCOUNTER — Ambulatory Visit (INDEPENDENT_AMBULATORY_CARE_PROVIDER_SITE_OTHER): Payer: PRIVATE HEALTH INSURANCE | Admitting: Medical

## 2015-03-29 VITALS — BP 126/91 | HR 85 | Temp 98.0°F | Ht 66.0 in | Wt 282.4 lb

## 2015-03-29 DIAGNOSIS — F419 Anxiety disorder, unspecified: Secondary | ICD-10-CM

## 2015-03-29 DIAGNOSIS — F418 Other specified anxiety disorders: Secondary | ICD-10-CM | POA: Diagnosis not present

## 2015-03-29 DIAGNOSIS — J0100 Acute maxillary sinusitis, unspecified: Secondary | ICD-10-CM

## 2015-03-29 DIAGNOSIS — F329 Major depressive disorder, single episode, unspecified: Secondary | ICD-10-CM

## 2015-03-29 MED ORDER — CEFTRIAXONE SODIUM 1 G IJ SOLR
1.0000 g | Freq: Once | INTRAMUSCULAR | Status: AC
  Administered 2015-03-29: 1 g via INTRAMUSCULAR

## 2015-03-29 MED ORDER — CITALOPRAM HYDROBROMIDE 20 MG PO TABS: 20.0000 mg | ORAL_TABLET | Freq: Every morning | ORAL | Status: DC

## 2015-03-29 MED ORDER — METHYLPREDNISOLONE ACETATE 40 MG/ML IJ SUSP
40.0000 mg | Freq: Once | INTRAMUSCULAR | Status: AC
  Administered 2015-03-29: 40 mg via INTRAMUSCULAR

## 2015-03-29 MED ORDER — MOMETASONE FUROATE 50 MCG/ACT NA SUSP: NASAL | Status: DC

## 2015-03-29 MED ORDER — ALBUTEROL SULFATE HFA 108 (90 BASE) MCG/ACT IN AERS: 2.0000 | INHALATION_SPRAY | Freq: Four times a day (QID) | RESPIRATORY_TRACT | Status: DC | PRN

## 2015-03-29 MED ORDER — BENZONATATE 200 MG PO CAPS: 200.0000 mg | ORAL_CAPSULE | Freq: Three times a day (TID) | ORAL | Status: DC | PRN

## 2015-03-29 MED ORDER — LEVOFLOXACIN 500 MG PO TABS: 500.0000 mg | ORAL_TABLET | Freq: Every day | ORAL | Status: DC

## 2015-03-29 NOTE — Progress Notes (Signed)
Pre visit review using our clinic review tool, if applicable. No additional management support is needed unless otherwise documented below in the visit note. 

## 2015-03-29 NOTE — Progress Notes (Signed)
Subjective:    Patient ID: Monique Gross, female    DOB: 03/31/1971, 44 y.o.   MRN: [id number]  HPI   Pt in with hx of sinus infections 2-3 times a year.('Symptoms started on Monday) Last wed was placed on augmentin. Pt states it did not help much at all. Very tender sinus both upper and lower teeth sensitivity.(lide sinus pressure worse than rt side)  On Friday st and chest congestion. Pt coughing moderate. Feels like need to clear chest but can't.   Sneezing a lot over past week. No itching eyes. Mild-moderate pnd. Blowing nose greenish-yellow mucous. Cougihng mucous.  Pt states needs refill. Celexa. Moderate anxiety. Stopped for 2 months. Lifestyle situations causing anxiety.    Review of Systems  Constitutional: Negative for fever, chills and fatigue.  HENT: Positive for congestion, postnasal drip, rhinorrhea, sinus pressure and sneezing. Negative for ear pain and nosebleeds.   Respiratory: Positive for cough. Negative for chest tightness, shortness of breath and wheezing.   Gastrointestinal: Negative for abdominal pain.  Psychiatric/Behavioral: Negative for suicidal ideas, behavioral problems, sleep disturbance, dysphoric mood, decreased concentration and agitation. The patient is nervous/anxious.    Past Medical History  Diagnosis Date  . HTN (hypertension)   . Urolithiasis   . Lumbar disc disease   . OSA (obstructive sleep apnea)   . Chronic kidney disease     hx of kidney stones  . Diverticulosis   . Migraines   . IBS (irritable bowel syndrome)   . Tubular adenoma of colon 12/2011  . Diverticulosis     History   Social History  . Marital Status: Married    Spouse Name: N/A  . Number of Children: 1  . Years of Education: N/A   Occupational History  . purchasing    Social History Main Topics  . Smoking status: Current Every Day Smoker -- 0.50 packs/day for 11 years    Types: Cigarettes  . Smokeless tobacco: Never Used  . Alcohol Use: No  . Drug Use: No    . Sexual Activity: Not on file   Other Topics Concern  . Not on file   Social History Narrative    Past Surgical History  Procedure Laterality Date  . Cesarean section    . Lumbar disc surgery  11-2008  . Tubal ligation  06/2011  . Lithotripsy    . Back surgery    . Partial hysterectomy      Family History  Problem Relation Age of Onset  . Urolithiasis Father   . Kidney disease Father     renal stones  . Thyroid disease Mother   . Thyroid disease Maternal Grandmother   . Diabetes Maternal Grandfather     No Known Allergies  Current Outpatient Prescriptions on File Prior to Visit  Medication Sig Dispense Refill  . Linaclotide (LINZESS) 145 MCG CAPS capsule Take 145 mcg by mouth daily as needed (constipation).    . polyethylene glycol (MIRALAX / GLYCOLAX) packet Take 17 g by mouth daily.       No current facility-administered medications on file prior to visit.    BP 126/91 mmHg  Pulse 85  Temp(Src) 98 F (36.7 C) (Oral)  Ht 5\' 6"  (1.676 m)  Wt 282 lb 6.4 oz (128.096 kg)  BMI 45.60 kg/m2  SpO2 96%  LMP 02/06/2013      Objective:   Physical Exam  General  Mental Status - Alert. General Appearance - Well groomed. Not in acute distress.  Skin Rashes- No  Rashes.  HEENT Head- Normal. Ear Auditory Canal - Left- Normal. Right - Normal.Tympanic Membrane- Left- Normal. Right- Normal. Eye Sclera/Conjunctiva- Left- Normal. Right- Normal. Nose & Sinuses Nasal Mucosa- Left-  Boggy and Congested. Right-  Boggy and  Congested.Bilateral moderate to sever maxillary and frontal sinus pressure. Mouth & Throat Lips: Upper Lip- Normal: no dryness, cracking, pallor, cyanosis, or vesicular eruption. Lower Lip-Normal: no dryness, cracking, pallor, cyanosis or vesicular eruption. Buccal Mucosa- Bilateral- No Aphthous ulcers. Oropharynx- No Discharge or Erythema. +pnd. Tonsils: Characteristics- Bilateral- No Erythema or Congestion. Size/Enlargement- Bilateral- No  enlargement. Discharge- bilateral-None.  Neck Neck- Supple. No Masses.   Chest and Lung Exam Auscultation: Breath Sounds:-Clear even and unlabored.  Cardiovascular Auscultation:Rythm- Regular, rate and rhythm. Murmurs & Other Heart Sounds:Ausculatation of the heart reveal- No Murmurs.  Lymphatic Head & Neck General Head & Neck Lymphatics: Bilateral: Description- No Localized lymphadenopathy.       Assessment & Plan:

## 2015-03-29 NOTE — Assessment & Plan Note (Addendum)
Your appear to have a sinus infection. I am prescribing  Levofloxin antibiotic for the infection(since no improvement with augmentin). To help with the nasal congestion I prescribed nasal steroid nasonex. For your associated cough, I prescribed cough medicine benzonatate  Since I think allergy preceded sinus infection decided to give solumedrol 40 mg im.   Also since both maxillary and frontal sinus tender with possible bronchitis on lung ausculatation. Will give rocephin 1 gram IM.  Rest, hydrate, tylenol for fever.  Follow up in 7 days or as needed.

## 2015-03-29 NOTE — Assessment & Plan Note (Signed)
Mild anxiety recently with life changes. Rx of celexa but not to use while on levoflloxin.

## 2015-03-29 NOTE — Patient Instructions (Addendum)
Sinusitis, acute maxillary Your appear to have a sinus infection. I am prescribing  Levofloxin antibiotic for the infection(since no improvement with augmentin). To help with the nasal congestion I prescribed nasal steroid nasonex. For your associated cough, I prescribed cough medicine benzonatate  Since I think allergy preceded sinus infection decided to give solumedrol 40 mg im.   Also since both maxillary and frontal sinus tender with possible bronchitis on lung ausculatation. Will give rocephin 1 gram IM.  Rest, hydrate, tylenol for fever.  Follow up in 7 days or as needed.   Anxiety and depression Mild anxiety recently with life changes. Rx of celexa but not to use while on levoflloxin.      Rx of albuterol for any wheezing.

## 2015-03-31 ENCOUNTER — Telehealth: Payer: Self-pay | Admitting: Family Medicine

## 2015-03-31 NOTE — Telephone Encounter (Signed)
Agree w/ advice given.  It is too early to switch or add meds since the Levaquin was just started yesterday

## 2015-03-31 NOTE — Telephone Encounter (Signed)
Caller name:Landa Grasse Relationship to patient:self Can be reached:[phone] ext [phone] Pharmacy:[facility] in [place]  Reason for call:Pt was seen by Evern Core on Tuesday, meds are not working. Could something stronger be called in? Please advise.

## 2015-03-31 NOTE — Telephone Encounter (Signed)
Pt stated that per Monique Gross she began the Levaquin yesterday. Pt states that she is having the worst facial pain and teeth pain. States her body just aches. Pt was advised to help the aches and pains by alternating tylenol and motrin. Pt was also advised that it could take 2-3 days to notice any change. Told to remain well hydrated and rest and to call the office if symptoms change or worsen.

## 2015-05-12 ENCOUNTER — Ambulatory Visit (INDEPENDENT_AMBULATORY_CARE_PROVIDER_SITE_OTHER): Payer: PRIVATE HEALTH INSURANCE | Admitting: Family Medicine

## 2015-05-12 ENCOUNTER — Encounter: Payer: Self-pay | Admitting: Family Medicine

## 2015-05-12 VITALS — BP 142/86 | HR 70 | Temp 98.0°F | Resp 16 | Wt 279.5 lb

## 2015-05-12 DIAGNOSIS — M5416 Radiculopathy, lumbar region: Secondary | ICD-10-CM

## 2015-05-12 MED ORDER — HYDROCODONE-ACETAMINOPHEN 5-325 MG PO TABS: 1.0000 | ORAL_TABLET | Freq: Four times a day (QID) | ORAL | Status: DC | PRN

## 2015-05-12 MED ORDER — CYCLOBENZAPRINE HCL 10 MG PO TABS: 10.0000 mg | ORAL_TABLET | Freq: Three times a day (TID) | ORAL | Status: DC | PRN

## 2015-05-12 NOTE — Patient Instructions (Signed)
Schedule your complete physical at your convenience Use the hydrocodone and flexeril as needed Call with any questions or concerns Hang in there!!!

## 2015-05-12 NOTE — Progress Notes (Signed)
   Subjective:    Patient ID: Monique Gross, female    DOB: Nov 02, 1971, 44 y.o.   MRN: [id number]  HPI Back pain- chronic problem for pt.  S/p surgery 2009.  Pt has appt scheduled w/ back surgeon Vertell Limber) in June but is in need of pain medication prior to that.  Pt has 2 herniated discs in back and 1 in neck.  Typically will use hydrocodone and muscle relaxer prn.  This week pt is having shooting pain down her L leg and having difficulty walking.  Will typically flare q2-3 months.  Pt is not interested in repeat surgery and was told to see PCP prior to next month's appt for pain meds.  No bowel or bladder incontinence.  + L leg weakness.   Review of Systems For ROS see HPI     Objective:   Physical Exam  Constitutional: She is oriented to person, place, and time. She appears well-developed and well-nourished. No distress.  HENT:  Head: Normocephalic and atraumatic.  Cardiovascular: Intact distal pulses.   Musculoskeletal: She exhibits tenderness (over L lumbar spine and paraspinal muscles, + SLR on L). She exhibits no edema.  Neurological: She is alert and oriented to person, place, and time. She has normal reflexes. No cranial nerve deficit. Coordination normal.  Antalgic gait  Skin: Skin is warm and dry.  Psychiatric: She has a normal mood and affect. Her behavior is normal. Thought content normal.  Vitals reviewed.         Assessment & Plan:

## 2015-05-12 NOTE — Progress Notes (Signed)
Pre visit review using our clinic review tool, if applicable. No additional management support is needed unless otherwise documented below in the visit note. 

## 2015-09-27 ENCOUNTER — Encounter: Payer: Self-pay | Admitting: Family Medicine

## 2015-09-27 ENCOUNTER — Ambulatory Visit (HOSPITAL_BASED_OUTPATIENT_CLINIC_OR_DEPARTMENT_OTHER)
Admission: RE | Admit: 2015-09-27 | Discharge: 2015-09-27 | Disposition: A | Discharge status: Home / Self Care | Payer: PRIVATE HEALTH INSURANCE | Source: Ambulatory Visit | Attending: Family Medicine | Admitting: Family Medicine

## 2015-09-27 ENCOUNTER — Ambulatory Visit (INDEPENDENT_AMBULATORY_CARE_PROVIDER_SITE_OTHER): Payer: PRIVATE HEALTH INSURANCE | Admitting: Family Medicine

## 2015-09-27 VITALS — BP 130/82 | HR 76 | Temp 98.1°F | Resp 16 | Ht 66.0 in | Wt 288.1 lb

## 2015-09-27 DIAGNOSIS — M79662 Pain in left lower leg: Secondary | ICD-10-CM

## 2015-09-27 DIAGNOSIS — Z0000 Encounter for general adult medical examination without abnormal findings: Secondary | ICD-10-CM

## 2015-09-27 DIAGNOSIS — Z23 Encounter for immunization: Secondary | ICD-10-CM | POA: Diagnosis not present

## 2015-09-27 LAB — HEPATIC FUNCTION PANEL
ALT: 15 U/L (ref 0–35)
AST: 14 U/L (ref 0–37)
Albumin: 4.1 g/dL (ref 3.5–5.2)
Alkaline Phosphatase: 55 U/L (ref 39–117)
Bilirubin, Direct: 0.1 mg/dL (ref 0.0–0.3)
TOTAL PROTEIN: 7.1 g/dL (ref 6.0–8.3)
Total Bilirubin: 0.5 mg/dL (ref 0.2–1.2)

## 2015-09-27 LAB — CBC WITH DIFFERENTIAL/PLATELET
BASOS ABS: 0 10*3/uL (ref 0.0–0.1)
Basophils Relative: 0.5 % (ref 0.0–3.0)
EOS ABS: 0.1 10*3/uL (ref 0.0–0.7)
Eosinophils Relative: 1 % (ref 0.0–5.0)
HCT: 43 % (ref 36.0–46.0)
HEMOGLOBIN: 14.5 g/dL (ref 12.0–15.0)
Lymphocytes Relative: 20.9 % (ref 12.0–46.0)
Lymphs Abs: 1.5 10*3/uL (ref 0.7–4.0)
MCHC: 33.8 g/dL (ref 30.0–36.0)
MCV: 90.3 fl (ref 78.0–100.0)
MONO ABS: 0.4 10*3/uL (ref 0.1–1.0)
Monocytes Relative: 5.5 % (ref 3.0–12.0)
Neutro Abs: 5.3 10*3/uL (ref 1.4–7.7)
Neutrophils Relative %: 72.1 % (ref 43.0–77.0)
Platelets: 219 10*3/uL (ref 150.0–400.0)
RBC: 4.77 Mil/uL (ref 3.87–5.11)
RDW: 13 % (ref 11.5–15.5)
WBC: 7.3 10*3/uL (ref 4.0–10.5)

## 2015-09-27 LAB — BASIC METABOLIC PANEL
BUN: 12 mg/dL (ref 6–23)
CHLORIDE: 103 meq/L (ref 96–112)
CO2: 27 meq/L (ref 19–32)
Calcium: 9.5 mg/dL (ref 8.4–10.5)
Creatinine, Ser: 0.71 mg/dL (ref 0.40–1.20)
GFR: 94.61 mL/min (ref >60.00)
Glucose, Bld: 102 mg/dL — ABNORMAL HIGH (ref 70–99)
Potassium: 3.9 mEq/L (ref 3.5–5.1)
Sodium: 139 mEq/L (ref 135–145)

## 2015-09-27 LAB — LIPID PANEL
Cholesterol: 161 mg/dL (ref 0–200)
HDL: 50 mg/dL (ref >39.00)
LDL Cholesterol: 80 mg/dL (ref 0–99)
NonHDL: 111.13
TRIGLYCERIDES: 156 mg/dL — AB (ref 0.0–149.0)
Total CHOL/HDL Ratio: 3
VLDL: 31.2 mg/dL (ref 0.0–40.0)

## 2015-09-27 LAB — VITAMIN D 25 HYDROXY (VIT D DEFICIENCY, FRACTURES): VITD: 20.2 ng/mL — AB (ref 30.00–100.00)

## 2015-09-27 LAB — TSH: TSH: 8.55 u[IU]/mL — ABNORMAL HIGH (ref 0.35–4.50)

## 2015-09-27 MED ORDER — CITALOPRAM HYDROBROMIDE 20 MG PO TABS: 20.0000 mg | ORAL_TABLET | Freq: Every morning | ORAL | Status: DC

## 2015-09-27 MED ORDER — LINACLOTIDE 145 MCG PO CAPS: 145.0000 ug | ORAL_CAPSULE | Freq: Every day | ORAL | Status: DC | PRN

## 2015-09-27 NOTE — Progress Notes (Signed)
Pre visit review using our clinic review tool, if applicable. No additional management support is needed unless otherwise documented below in the visit note. 

## 2015-09-27 NOTE — Assessment & Plan Note (Signed)
Pt's PE WNL w/ exception of obesity and L lower leg swelling and pain.  UTD on pap, colonoscopy- due for mammo.  Pt encouraged to schedule.  Flu shot given.  Check labs.  Anticipatory guidance provided- stressed need of healthy diet and regular exercise.

## 2015-09-27 NOTE — Patient Instructions (Signed)
Schedule an appointment in 6 months with your new doctor We'll notify you of your lab results and make any changes if needed CALL AND SCHEDULE YOUR MAMMOGRAM!!! Continue to work on healthy diet and regular exercise Call with any questions or concerns Happy Bel[employer] Birthday!!!

## 2015-09-27 NOTE — Assessment & Plan Note (Signed)
New.  Pt reports pain and swelling of medial L lower leg x1 yr.  No known injury.  Area of concern is almost cord like.  No overlying redness or edema.  Will get [place] to assess.  Pt expressed understanding and is in agreement w/ plan.

## 2015-09-27 NOTE — Progress Notes (Signed)
   Subjective:    Patient ID: Monique Gross, female    DOB: 12-Jul-1971, 44 y.o.   MRN: [id number]  HPI CPE- UTD on colonoscopy, due for repeat in 2018 w/ Dr Fuller Plan.  UTD on pap w/ Dr Nori Riis (done 2014).  Due for mammo- pt plans to schedule.   Review of Systems Patient reports no vision/ hearing changes, adenopathy,fever, weight change,  persistant/recurrent hoarseness , swallowing issues, chest pain, palpitations, edema, persistant/recurrent cough, hemoptysis, dyspnea (rest/exertional/paroxysmal nocturnal), gastrointestinal bleeding (melena, rectal bleeding), abdominal pain, significant heartburn, bowel changes, GU symptoms (dysuria, hematuria, incontinence), Gyn symptoms (abnormal  bleeding, pain),  syncope, focal weakness, memory loss, numbness & tingling, skin/hair/nail changes, abnormal bruising or bleeding, anxiety, or depression.   L lower leg pain and swelling- pt has firm area of L medial lower leg that is TTP and has been present for last year.  No known injury    Objective:   Physical Exam General Appearance:    Alert, cooperative, no distress, appears stated age, obese  Head:    Normocephalic, without obvious abnormality, atraumatic  Eyes:    PERRL, conjunctiva/corneas clear, EOM's intact, fundi    benign, both eyes  Ears:    Normal TM's and external ear canals, both ears  Nose:   Nares normal, septum midline, mucosa normal, no drainage    or sinus tenderness  Throat:   Lips, mucosa, and tongue normal; teeth and gums normal  Neck:   Supple, symmetrical, trachea midline, no adenopathy;    Thyroid: no enlargement/tenderness/nodules  Back:     Symmetric, no curvature, ROM normal, no CVA tenderness  Lungs:     Clear to auscultation bilaterally, respirations unlabored  Chest Wall:    No tenderness or deformity   Heart:    Regular rate and rhythm, S1 and S2 normal, no murmur, rub   or gallop  Breast Exam:    Deferred to GYN  Abdomen:     Soft, non-tender, bowel sounds active all  four quadrants,    no masses, no organomegaly  Genitalia:    Deferred to GYN  Rectal:    Extremities:   Extremities normal, atraumatic, no cyanosis or edema.  Cord like area of medial L lower leg, very TTP but no overlying erythema and no edema  Pulses:   2+ and symmetric all extremities  Skin:   Skin color, texture, turgor normal, no rashes or lesions  Lymph nodes:   Cervical, supraclavicular, and axillary nodes normal  Neurologic:   CNII-XII intact, normal strength, sensation and reflexes    throughout          Assessment & Plan:

## 2015-09-28 ENCOUNTER — Telehealth: Payer: Self-pay | Admitting: General Practice

## 2015-09-28 NOTE — Telephone Encounter (Signed)
-----   Message from Midge Minium, MD sent at 09/28/2015  8:10 AM EDT ----- Did she not get this done yesterday?  She was supposed to go straight downstairs...  ----- Message -----    From: SYSTEM    Sent: 09/28/2015  12:04 AM      To: Midge Minium, MD

## 2015-09-28 NOTE — Telephone Encounter (Signed)
Noted. Thank you for checking.

## 2015-09-28 NOTE — Telephone Encounter (Signed)
Called and spoke with pt, she advised that due to emergencies in the imaging suite they advised that it would be a 2 hour wait. She was rescheduled for 10/05/15 @ 2:30pm.

## 2015-09-29 ENCOUNTER — Other Ambulatory Visit: Payer: Self-pay | Admitting: General Practice

## 2015-09-29 DIAGNOSIS — E0590 Thyrotoxicosis, unspecified without thyrotoxic crisis or storm: Secondary | ICD-10-CM

## 2015-09-29 MED ORDER — VITAMIN D (ERGOCALCIFEROL) 1.25 MG (50000 UNIT) PO CAPS: 50000.0000 [IU] | ORAL_CAPSULE | ORAL | Status: DC

## 2015-09-29 MED ORDER — LEVOTHYROXINE SODIUM 75 MCG PO TABS: 75.0000 ug | ORAL_TABLET | Freq: Every day | ORAL | Status: DC

## 2015-10-05 ENCOUNTER — Ambulatory Visit (HOSPITAL_BASED_OUTPATIENT_CLINIC_OR_DEPARTMENT_OTHER)
Admission: RE | Admit: 2015-10-05 | Discharge: 2015-10-05 | Disposition: A | Discharge status: Home / Self Care | Payer: PRIVATE HEALTH INSURANCE | Source: Ambulatory Visit | Attending: Family Medicine | Admitting: Family Medicine

## 2015-10-05 DIAGNOSIS — I8390 Asymptomatic varicose veins of unspecified lower extremity: Secondary | ICD-10-CM | POA: Insufficient documentation

## 2015-10-05 DIAGNOSIS — Z87891 Personal history of nicotine dependence: Secondary | ICD-10-CM | POA: Insufficient documentation

## 2015-10-05 DIAGNOSIS — R2242 Localized swelling, mass and lump, left lower limb: Secondary | ICD-10-CM | POA: Diagnosis present

## 2015-10-31 ENCOUNTER — Encounter: Payer: Self-pay | Admitting: General Practice

## 2015-10-31 ENCOUNTER — Other Ambulatory Visit (INDEPENDENT_AMBULATORY_CARE_PROVIDER_SITE_OTHER): Payer: PRIVATE HEALTH INSURANCE

## 2015-10-31 DIAGNOSIS — E0590 Thyrotoxicosis, unspecified without thyrotoxic crisis or storm: Secondary | ICD-10-CM | POA: Diagnosis not present

## 2015-10-31 LAB — TSH: TSH: 2.71 u[IU]/mL (ref 0.35–4.50)

## 2015-11-21 ENCOUNTER — Encounter: Payer: Self-pay | Admitting: Family Medicine

## 2015-11-21 ENCOUNTER — Emergency Department (HOSPITAL_BASED_OUTPATIENT_CLINIC_OR_DEPARTMENT_OTHER)
Admission: EM | Admit: 2015-11-21 | Discharge: 2015-11-21 | Disposition: A | Discharge status: Home / Self Care | Payer: PRIVATE HEALTH INSURANCE | Attending: Emergency Medicine | Admitting: Emergency Medicine

## 2015-11-21 ENCOUNTER — Ambulatory Visit (INDEPENDENT_AMBULATORY_CARE_PROVIDER_SITE_OTHER): Payer: PRIVATE HEALTH INSURANCE | Admitting: Family Medicine

## 2015-11-21 ENCOUNTER — Encounter (HOSPITAL_BASED_OUTPATIENT_CLINIC_OR_DEPARTMENT_OTHER): Payer: Self-pay

## 2015-11-21 VITALS — BP 126/82 | HR 71 | Temp 98.4°F | Resp 16 | Ht 66.0 in | Wt 289.4 lb

## 2015-11-21 DIAGNOSIS — Z87442 Personal history of urinary calculi: Secondary | ICD-10-CM | POA: Diagnosis not present

## 2015-11-21 DIAGNOSIS — Z8719 Personal history of other diseases of the digestive system: Secondary | ICD-10-CM | POA: Insufficient documentation

## 2015-11-21 DIAGNOSIS — W5501XA Bitten by cat, initial encounter: Secondary | ICD-10-CM | POA: Diagnosis not present

## 2015-11-21 DIAGNOSIS — Z79899 Other long term (current) drug therapy: Secondary | ICD-10-CM | POA: Insufficient documentation

## 2015-11-21 DIAGNOSIS — Y9289 Other specified places as the place of occurrence of the external cause: Secondary | ICD-10-CM | POA: Insufficient documentation

## 2015-11-21 DIAGNOSIS — Z23 Encounter for immunization: Secondary | ICD-10-CM

## 2015-11-21 DIAGNOSIS — S61531A Puncture wound without foreign body of right wrist, initial encounter: Secondary | ICD-10-CM | POA: Diagnosis not present

## 2015-11-21 DIAGNOSIS — F418 Other specified anxiety disorders: Secondary | ICD-10-CM | POA: Diagnosis not present

## 2015-11-21 DIAGNOSIS — S51851A Open bite of right forearm, initial encounter: Secondary | ICD-10-CM

## 2015-11-21 DIAGNOSIS — N189 Chronic kidney disease, unspecified: Secondary | ICD-10-CM | POA: Insufficient documentation

## 2015-11-21 DIAGNOSIS — K589 Irritable bowel syndrome without diarrhea: Secondary | ICD-10-CM | POA: Diagnosis not present

## 2015-11-21 DIAGNOSIS — I129 Hypertensive chronic kidney disease with stage 1 through stage 4 chronic kidney disease, or unspecified chronic kidney disease: Secondary | ICD-10-CM | POA: Insufficient documentation

## 2015-11-21 DIAGNOSIS — F419 Anxiety disorder, unspecified: Principal | ICD-10-CM

## 2015-11-21 DIAGNOSIS — F17210 Nicotine dependence, cigarettes, uncomplicated: Secondary | ICD-10-CM | POA: Diagnosis not present

## 2015-11-21 DIAGNOSIS — Y9389 Activity, other specified: Secondary | ICD-10-CM | POA: Diagnosis not present

## 2015-11-21 DIAGNOSIS — Y998 Other external cause status: Secondary | ICD-10-CM | POA: Diagnosis not present

## 2015-11-21 DIAGNOSIS — F32A Depression, unspecified: Secondary | ICD-10-CM

## 2015-11-21 DIAGNOSIS — F329 Major depressive disorder, single episode, unspecified: Secondary | ICD-10-CM

## 2015-11-21 DIAGNOSIS — G43909 Migraine, unspecified, not intractable, without status migrainosus: Secondary | ICD-10-CM | POA: Insufficient documentation

## 2015-11-21 DIAGNOSIS — S51859A Open bite of unspecified forearm, initial encounter: Secondary | ICD-10-CM | POA: Insufficient documentation

## 2015-11-21 MED ORDER — RABIES IMMUNE GLOBULIN 150 UNIT/ML IM INJ
20.0000 [IU]/kg | INJECTION | Freq: Once | INTRAMUSCULAR | Status: AC
  Administered 2015-11-21: 2475 [IU]
  Filled 2015-11-21: qty 18

## 2015-11-21 MED ORDER — LEVOTHYROXINE SODIUM 75 MCG PO TABS: 75.0000 ug | ORAL_TABLET | Freq: Every day | ORAL | Status: DC

## 2015-11-21 MED ORDER — RABIES VACCINE, PCEC IM SUSR
1.0000 mL | Freq: Once | INTRAMUSCULAR | Status: AC
  Administered 2015-11-21: 1 mL via INTRAMUSCULAR
  Filled 2015-11-21: qty 1

## 2015-11-21 MED ORDER — AMOXICILLIN-POT CLAVULANATE 875-125 MG PO TABS: 1.0000 | ORAL_TABLET | Freq: Two times a day (BID) | ORAL | Status: DC

## 2015-11-21 MED ORDER — CITALOPRAM HYDROBROMIDE 40 MG PO TABS: 40.0000 mg | ORAL_TABLET | Freq: Every day | ORAL | Status: DC

## 2015-11-21 NOTE — Progress Notes (Signed)
   Subjective:    Patient ID: Monique Gross, female    DOB: 14-Dec-1971, 44 y.o.   MRN: [id number]  HPI Cat bite- pt was bitten by stray cat on Wednesday night.  Pt w/ extensive bruising around central puncture wound on R forearm.  No drainage.  Pt w/ scratches.  No fevers.  HA x3 days.  Did not want to go to ER.  Depression- pt is tearful.  Report work is crazy, home is difficult.  Has been doing well on Celexa 20mg  until recently.  Reports she is totally overwhelmed at this point.   Review of Systems For ROS see HPI     Objective:   Physical Exam  Constitutional: She is oriented to person, place, and time. She appears well-developed and well-nourished. No distress.  HENT:  Head: Normocephalic and atraumatic.  Cardiovascular: Intact distal pulses.   Musculoskeletal: She exhibits edema (of R forearm) and tenderness (TTP over R forearm w/ extensive bruising).  Neurological: She is alert and oriented to person, place, and time.  Skin: Skin is warm and dry. No erythema.  Large bruise on R forearm surrounding central puncture site.  + swelling.  No fluctuance or drainage.  Psychiatric: Her behavior is normal. Judgment and thought content normal.  tearful  Vitals reviewed.         Assessment & Plan:

## 2015-11-21 NOTE — Patient Instructions (Signed)
Follow up in 3-4 weeks to recheck mood Head down to the ER to start the rabies series START the Augmentin twice daily- take w/ food- to prevent infection Increase the Celexa to 40mg  daily (2 of what you have at home, 1 of the new prescription) Call with any questions or concerns If you want to join [place] at the new [place] office, any scheduled appointments will automatically transfer and we will see you at [address] [place] [address] [place], [place] [address] (OPENING 12/27/15) HANG IN THERE!!!

## 2015-11-21 NOTE — Progress Notes (Signed)
Pre visit review using our clinic review tool, if applicable. No additional management support is needed unless otherwise documented below in the visit note. 

## 2015-11-21 NOTE — ED Provider Notes (Signed)
CSN: [id number]     Arrival date & time 11/21/15  1214 History   First MD Initiated Contact with Patient 11/21/15 1250     Chief Complaint  Patient presents with  . Animal Bite     (Consider location/radiation/quality/duration/timing/severity/associated sxs/prior Treatment) HPI Comments: Patient presents to the emergency department with chief complaint of animal bite. She states that she was bitten by a cat last Wednesday. There was a stray cat. She saw her primary care doctor today, and was urged to come to the emergency department for rabies prophylaxis. The cat was never caught. Animal control was notified. Patient was updated on her tetanus as well as given Augmentin by her PCP. He complains of mild pain on her anterior right wrist where the cat bit her. She denies any fevers, chills, nausea, or vomiting. Symptoms are aggravated with palpation. There are no other associated symptoms.  The history is provided by the patient. No language interpreter was used.    Past Medical History  Diagnosis Date  . HTN (hypertension)   . Urolithiasis   . Lumbar disc disease   . OSA (obstructive sleep apnea)   . Chronic kidney disease     hx of kidney stones  . Diverticulosis   . Migraines   . IBS (irritable bowel syndrome)   . Tubular adenoma of colon 12/2011  . Diverticulosis    Past Surgical History  Procedure Laterality Date  . Cesarean section    . Lumbar disc surgery  11-2008  . Tubal ligation  06/2011  . Lithotripsy    . Back surgery    . Partial hysterectomy     Family History  Problem Relation Age of Onset  . Urolithiasis Father   . Kidney disease Father     renal stones  . Thyroid disease Mother   . Thyroid disease Maternal Grandmother   . Diabetes Maternal Grandfather    Social History  Substance Use Topics  . Smoking status: Current Every Day Smoker -- 0.50 packs/day for 11 years    Types: Cigarettes  . Smokeless tobacco: Never Used  . Alcohol Use: No   OB History     No data available     Review of Systems  Constitutional: Negative for fever and chills.  Respiratory: Negative for shortness of breath.   Cardiovascular: Negative for chest pain.  Gastrointestinal: Negative for nausea, vomiting, diarrhea and constipation.  Genitourinary: Negative for dysuria.  Skin: Positive for wound.      Allergies  Review of patient's allergies indicates no known allergies.  Home Medications   Prior to Admission medications   Medication Sig Start Date End Date Taking? Authorizing Provider  amoxicillin-clavulanate (AUGMENTIN) 875-125 MG tablet Take 1 tablet by mouth 2 (two) times daily. 11/21/15   Midge Minium, MD  citalopram (CELEXA) 40 MG tablet Take 1 tablet (40 mg total) by mouth daily. 11/21/15   Midge Minium, MD  cyclobenzaprine (FLEXERIL) 10 MG tablet Take 1 tablet (10 mg total) by mouth 3 (three) times daily as needed for muscle spasms. 05/12/15   Midge Minium, MD  HYDROcodone-acetaminophen (NORCO/VICODIN) 5-325 MG per tablet Take 1 tablet by mouth every 6 (six) hours as needed for moderate pain. 05/12/15   Midge Minium, MD  levothyroxine (SYNTHROID, LEVOTHROID) 75 MCG tablet Take 1 tablet (75 mcg total) by mouth daily. 11/21/15   Midge Minium, MD  Linaclotide [facility]) 145 MCG CAPS capsule Take 1 capsule (145 mcg total) by mouth daily as needed (constipation).  09/27/15   Midge Minium, MD  polyethylene glycol (MIRALAX / Floria Raveling) packet Take 17 g by mouth daily.      Historical Provider, MD  Vitamin D, Ergocalciferol, (DRISDOL) 50000 UNITS CAPS capsule Take 1 capsule (50,000 Units total) by mouth every 7 (seven) days. 09/29/15   Midge Minium, MD   BP 157/99 mmHg  Pulse 63  Temp(Src) 98.2 F (36.8 C) (Oral)  Resp 20  Ht 5\' 6"  (1.676 m)  Wt 124.739 kg  BMI 44.41 kg/m2  SpO2 98%  LMP 02/06/2013 Physical Exam  Constitutional: She is oriented to person, place, and time. She appears well-developed and  well-nourished.  HENT:  Head: Normocephalic and atraumatic.  Eyes: Conjunctivae and EOM are normal.  Neck: Normal range of motion.  Cardiovascular: Normal rate.   Pulmonary/Chest: Effort normal.  Abdominal: She exhibits no distension.  Musculoskeletal: Normal range of motion.  Range of motion and strength right wrist is 5/5  Neurological: She is alert and oriented to person, place, and time.  Skin: Skin is dry.  Quarter sized area of ecchymosis with small central puncture wound on anterior right wrist, no surrounding cellulitis, no evidence of abscess   Psychiatric: She has a normal mood and affect. Her behavior is normal. Judgment and thought content normal.  Nursing note and vitals reviewed.   ED Course  Procedures (including critical care time)   MDM   Final diagnoses:  Cat bite of forearm, right, initial encounter  Rabies, need for prophylactic vaccination against    Patient with cat bite on right wrist. Tetanus is current. Patient is taking Augmentin. She will need rabies prophylaxis.  Verbal and written follow-up instructions given. Patient will need to complete the rabies course. She has been given a handout regarding this. She is otherwise well-appearing, and is stable and ready for discharge.   Montine Circle, PA-C 11/21/15 [place], DO 11/22/15 1349

## 2015-11-21 NOTE — Assessment & Plan Note (Signed)
Deteriorated.  Pt reports anxiety and depression are worse than previous.  Based on this, will increase Celexa to 40mg  and monitor for improvement.  If no improvement, will consider adding Wellbutrin.  Pt expressed understanding and is in agreement w/ plan.

## 2015-11-21 NOTE — Discharge Instructions (Signed)

## 2015-11-21 NOTE — ED Notes (Signed)
Patient stable and ambulatory. Patient verbalizes understanding of discharge instructions and follow-up. 

## 2015-11-21 NOTE — ED Notes (Signed)
Stray cat bite to right forearm last week-pt reports animal control has been contacted-sent from PCP office in building

## 2015-11-21 NOTE — Assessment & Plan Note (Signed)
New.  Pt was bit by a stray cat 5 days ago.  Needs to start Rabies series- to do this will need to go to ER.  Given puncture wound and surrounding swelling, will start Augmentin.  TDaP updated.  Reviewed supportive care and red flags that should prompt return.  Pt expressed understanding and is in agreement w/ plan.

## 2015-11-24 ENCOUNTER — Encounter (HOSPITAL_COMMUNITY): Payer: Self-pay

## 2015-11-24 ENCOUNTER — Emergency Department (INDEPENDENT_AMBULATORY_CARE_PROVIDER_SITE_OTHER)
Admission: EM | Admit: 2015-11-24 | Discharge: 2015-11-24 | Disposition: A | Discharge status: Home / Self Care | Payer: PRIVATE HEALTH INSURANCE | Source: Home / Self Care

## 2015-11-24 DIAGNOSIS — Z203 Contact with and (suspected) exposure to rabies: Secondary | ICD-10-CM | POA: Diagnosis not present

## 2015-11-24 MED ORDER — RABIES VACCINE, PCEC IM SUSR
1.0000 mL | Freq: Once | INTRAMUSCULAR | Status: AC
  Administered 2015-11-24: 1 mL via INTRAMUSCULAR

## 2015-11-24 MED ORDER — RABIES VACCINE, PCEC IM SUSR
INTRAMUSCULAR | Status: AC
  Filled 2015-11-24: qty 1

## 2015-11-24 NOTE — ED Notes (Signed)
Here for day #3, shot #2 in series for rabies series started at [facility] on 11-28. Patient denies any c/o

## 2015-11-28 ENCOUNTER — Emergency Department (INDEPENDENT_AMBULATORY_CARE_PROVIDER_SITE_OTHER)
Admission: EM | Admit: 2015-11-28 | Discharge: 2015-11-28 | Disposition: A | Discharge status: Home / Self Care | Payer: PRIVATE HEALTH INSURANCE | Source: Home / Self Care

## 2015-11-28 ENCOUNTER — Encounter (HOSPITAL_COMMUNITY): Payer: Self-pay

## 2015-11-28 DIAGNOSIS — Z203 Contact with and (suspected) exposure to rabies: Secondary | ICD-10-CM | POA: Diagnosis not present

## 2015-11-28 MED ORDER — RABIES VACCINE, PCEC IM SUSR
1.0000 mL | Freq: Once | INTRAMUSCULAR | Status: AC
  Administered 2015-11-28: 1 mL via INTRAMUSCULAR

## 2015-11-28 MED ORDER — RABIES VACCINE, PCEC IM SUSR
INTRAMUSCULAR | Status: AC
  Filled 2015-11-28: qty 1

## 2015-11-28 NOTE — ED Notes (Signed)
here for day #50m shot #3; denies problems

## 2015-12-05 ENCOUNTER — Emergency Department (INDEPENDENT_AMBULATORY_CARE_PROVIDER_SITE_OTHER)
Admission: EM | Admit: 2015-12-05 | Discharge: 2015-12-05 | Disposition: A | Discharge status: Home / Self Care | Payer: PRIVATE HEALTH INSURANCE | Source: Home / Self Care

## 2015-12-05 ENCOUNTER — Encounter (HOSPITAL_COMMUNITY): Payer: Self-pay

## 2015-12-05 DIAGNOSIS — Z203 Contact with and (suspected) exposure to rabies: Secondary | ICD-10-CM | POA: Diagnosis not present

## 2015-12-05 MED ORDER — RABIES VACCINE, PCEC IM SUSR
INTRAMUSCULAR | Status: AC
  Filled 2015-12-05: qty 1

## 2015-12-05 MED ORDER — RABIES VACCINE, PCEC IM SUSR
1.0000 mL | Freq: Once | INTRAMUSCULAR | Status: AC
  Administered 2015-12-05: 1 mL via INTRAMUSCULAR

## 2015-12-05 NOTE — ED Notes (Signed)
Here for day #14 of rabies series, , shot #4 . denies problems

## 2015-12-12 ENCOUNTER — Ambulatory Visit (INDEPENDENT_AMBULATORY_CARE_PROVIDER_SITE_OTHER): Payer: PRIVATE HEALTH INSURANCE | Admitting: Family Medicine

## 2015-12-12 ENCOUNTER — Encounter: Payer: Self-pay | Admitting: Family Medicine

## 2015-12-12 VITALS — BP 132/82 | HR 72 | Temp 98.0°F | Resp 16 | Ht 66.0 in | Wt 291.5 lb

## 2015-12-12 DIAGNOSIS — F418 Other specified anxiety disorders: Secondary | ICD-10-CM | POA: Diagnosis not present

## 2015-12-12 DIAGNOSIS — F32A Depression, unspecified: Secondary | ICD-10-CM

## 2015-12-12 DIAGNOSIS — F419 Anxiety disorder, unspecified: Principal | ICD-10-CM

## 2015-12-12 DIAGNOSIS — F329 Major depressive disorder, single episode, unspecified: Secondary | ICD-10-CM

## 2015-12-12 MED ORDER — CITALOPRAM HYDROBROMIDE 20 MG PO TABS: 20.0000 mg | ORAL_TABLET | Freq: Every day | ORAL | Status: DC

## 2015-12-12 MED ORDER — BUPROPION HCL ER (XL) 150 MG PO TB24: 150.0000 mg | ORAL_TABLET | Freq: Every day | ORAL | Status: DC

## 2015-12-12 NOTE — Progress Notes (Signed)
Pre visit review using our clinic review tool, if applicable. No additional management support is needed unless otherwise documented below in the visit note. 

## 2015-12-12 NOTE — Progress Notes (Signed)
   Subjective:    Patient ID: Monique Gross, female    DOB: 01/07/1971, 44 y.o.   MRN: [id number]  HPI Anxiety/depression- pt reports increasing Celexa to 40mg  has 'made me evil'.  Much more short tempered, quick to anger.  Denies tearfulness or overwhelmed.  'just angry'.  Pt is waking at 3:30 am every morning x1 week.  At last visit, very tearful, overwhelmed, anxious, depressed.   Review of Systems For ROS see HPI     Objective:   Physical Exam  Constitutional: She is oriented to person, place, and time. She appears well-developed and well-nourished. No distress.  HENT:  Head: Normocephalic and atraumatic.  Neurological: She is alert and oriented to person, place, and time.  Psychiatric: She has a normal mood and affect. Her behavior is normal. Thought content normal.  Vitals reviewed.         Assessment & Plan:

## 2015-12-12 NOTE — Assessment & Plan Note (Signed)
Pt reports she is no longer feeling sad and overwhelmed, just angry.  Due to increased anger, we will decrease Celexa back to 20mg  daily and add Wellbutrin to improve energy level, mood, and anxiety.  Did caution her that this can be too stimulating for some patients and if she runs into that, she is to call right away for a medication adjustment.  Pt expressed understanding and is in agreement w/ plan.

## 2015-12-12 NOTE — Patient Instructions (Signed)
Follow up by phone or MyChart in 3-4 weeks Decrease the Celexa back to 20mg  daily Add the Wellbutrin daily in the AM Call with any questions or concerns If you want to join [place] at the new [place] office, any scheduled appointments will automatically transfer and we will see you at [address] [place] [address] [place], [place] [address]  (OPENING 12/27/15) Happy Holidays!!!

## 2016-01-06 ENCOUNTER — Telehealth: Payer: Self-pay | Admitting: Family Medicine

## 2016-01-06 NOTE — Telephone Encounter (Signed)
Called patient left message on answering machine that she needs to come in for appointment since she has been on ABT for 6 days and no better.

## 2016-01-06 NOTE — Telephone Encounter (Signed)
Unfortunately, we can not call in anything stronger w/o an OV.  If she has been on Augmentin for 6 days and it's not responding it is either a viral illness that will run it's course w/ time or we need to regroup, re-examine, get vitals, and determine how severe this may be.  But we cannot change abx over the phone for a pt we did not originally treat

## 2016-01-06 NOTE — Telephone Encounter (Signed)
Called patient, left message for call back regarding symptoms she is having.

## 2016-01-06 NOTE — Telephone Encounter (Signed)
Pt work # [phone] x [phone]  Pt went to a [facility] 6 days ago for sinus infection. They called her in amoxi/clav and she is not having any improvement. She is wanting to know if you can call her in something stronger without having to come in. She cannot afford to go back to urgent care. She called urgent care and they won't call in anything different without a visit and she doesn't have $75.

## 2016-01-06 NOTE — Telephone Encounter (Signed)
Patient returned call regarding symptoms. States she is currently having facial pain,swollen cheeks,nasal congestion,mild headache. No SOB,No chest pain. Patient would like to know if she could have stronger medication called in. Did not want to come in.

## 2016-01-07 ENCOUNTER — Ambulatory Visit (INDEPENDENT_AMBULATORY_CARE_PROVIDER_SITE_OTHER): Payer: Managed Care, Other (non HMO) | Admitting: Internal Medicine

## 2016-01-07 ENCOUNTER — Encounter: Payer: Self-pay | Admitting: Internal Medicine

## 2016-01-07 VITALS — BP 138/90 | HR 79 | Temp 98.5°F | Resp 18 | Wt 284.0 lb

## 2016-01-07 DIAGNOSIS — J019 Acute sinusitis, unspecified: Secondary | ICD-10-CM

## 2016-01-07 DIAGNOSIS — R739 Hyperglycemia, unspecified: Secondary | ICD-10-CM | POA: Diagnosis not present

## 2016-01-07 DIAGNOSIS — I10 Essential (primary) hypertension: Secondary | ICD-10-CM

## 2016-01-07 MED ORDER — HYDROCODONE-HOMATROPINE 5-1.5 MG/5ML PO SYRP: 5.0000 mL | ORAL_SOLUTION | Freq: Four times a day (QID) | ORAL | Status: DC | PRN

## 2016-01-07 MED ORDER — LEVOFLOXACIN 500 MG PO TABS
500.0000 mg | ORAL_TABLET | Freq: Every day | ORAL | Status: DC
Start: 2016-01-07 — End: 2016-07-25

## 2016-01-07 NOTE — Assessment & Plan Note (Signed)
stable overall by history and exam, recent data reviewed with pt, and pt to continue medical treatment as before,  to f/u any worsening symptoms or concerns BP Readings from Last 3 Encounters:  01/07/16 138/90  12/12/15 132/82  12/05/15 147/95

## 2016-01-07 NOTE — Progress Notes (Signed)
Subjective:    Patient ID: Monique Gross, female    DOB: 12/25/1970, 45 y.o.   MRN: [id number]  HPI  Here with 2-3 days acute onset fever, facial pain, pressure, headache, general weakness and malaise, and greenish d/c, with mild ST and cough, but pt denies chest pain, wheezing, increased sob or doe, orthopnea, PND, increased LE swelling, palpitations, dizziness or syncope. Pt denies new neurological symptoms such as new headache, or facial or extremity weakness or numbness   Pt denies polydipsia, polyuria Past Medical History  Diagnosis Date  . HTN (hypertension)   . Urolithiasis   . Lumbar disc disease   . OSA (obstructive sleep apnea)   . Chronic kidney disease     hx of kidney stones  . Diverticulosis   . Migraines   . IBS (irritable bowel syndrome)   . Tubular adenoma of colon 12/2011  . Diverticulosis    Past Surgical History  Procedure Laterality Date  . Cesarean section    . Lumbar disc surgery  11-2008  . Tubal ligation  06/2011  . Lithotripsy    . Back surgery    . Partial hysterectomy      reports that she has been smoking Cigarettes.  She has a 5.5 pack-year smoking history. She has never used smokeless tobacco. She reports that she does not drink alcohol or use illicit drugs. family history includes Diabetes in her maternal grandfather; Kidney disease in her father; Thyroid disease in her maternal grandmother and mother; Urolithiasis in her father. No Known Allergies Current Outpatient Prescriptions on File Prior to Visit  Medication Sig Dispense Refill  . buPROPion (WELLBUTRIN XL) 150 MG 24 hr tablet Take 1 tablet (150 mg total) by mouth daily. 30 tablet 3  . citalopram (CELEXA) 20 MG tablet Take 1 tablet (20 mg total) by mouth daily. 30 tablet 3  . cyclobenzaprine (FLEXERIL) 10 MG tablet Take 1 tablet (10 mg total) by mouth 3 (three) times daily as needed for muscle spasms. 30 tablet 0  . HYDROcodone-acetaminophen (NORCO/VICODIN) 5-325 MG per tablet Take 1 tablet  by mouth every 6 (six) hours as needed for moderate pain. 30 tablet 0  . levothyroxine (SYNTHROID, LEVOTHROID) 75 MCG tablet Take 1 tablet (75 mcg total) by mouth daily. 30 tablet 12  . Linaclotide (LINZESS) 145 MCG CAPS capsule Take 1 capsule (145 mcg total) by mouth daily as needed (constipation). 30 capsule 11  . polyethylene glycol (MIRALAX / GLYCOLAX) packet Take 17 g by mouth daily.       No current facility-administered medications on file prior to visit.   Review of Systems  Constitutional: Negative for unusual diaphoresis or night sweats HENT: Negative for ringing in ear or discharge Eyes: Negative for double vision or worsening visual disturbance.  Respiratory: Negative for choking and stridor.   Gastrointestinal: Negative for vomiting or other signifcant bowel change Genitourinary: Negative for hematuria or change in urine volume.  Musculoskeletal: Negative for other MSK pain or swelling Skin: Negative for color change and worsening wound.  Neurological: Negative for tremors and numbness other than noted  Psychiatric/Behavioral: Negative for decreased concentration or agitation other than above       Objective:   Physical Exam BP 138/90 mmHg  Pulse 79  Temp(Src) 98.5 F (36.9 C) (Oral)  Resp 18  Wt 284 lb (128.822 kg)  SpO2 97%  LMP 02/06/2013 VS noted, mild ill Constitutional: Pt appears in no significant distress HENT: Head: NCAT.  Right Ear: External ear normal.  Left  Ear: External ear normal.  Bilat tm's with mild erythema.  Max sinus areas mod tender.  Pharynx with mild erythema, no exudate Eyes: . Pupils are equal, round, and reactive to light. Conjunctivae and EOM are normal Neck: Normal range of motion. Neck supple.  Cardiovascular: Normal rate and regular rhythm.   Pulmonary/Chest: Effort normal and breath sounds without rales or wheezing.  Neurological: Pt is alert. Not confused , motor grossly intact Skin: Skin is warm. No rash, no LE edema Psychiatric:  Pt behavior is normal. No agitation.     Assessment & Plan:

## 2016-01-07 NOTE — Patient Instructions (Signed)
Please take all new medication as prescribed - the antibiotic, and cough medicine if needed  You can also take Mucinex (or it's generic off brand) for congestion, and tylenol as needed for pain.  Please continue all other medications as before, and refills have been done if requested.  Please have the pharmacy call with any other refills you may need.  Please keep your appointments with your specialists as you may have planned   

## 2016-01-07 NOTE — Progress Notes (Signed)
Pre visit review using our clinic review tool, if applicable. No additional management support is needed unless otherwise documented below in the visit note. 

## 2016-01-07 NOTE — Assessment & Plan Note (Signed)
Very mild noted on labs from June 2013, to cont wt loss efforts, call for onset polys with illness

## 2016-03-14 ENCOUNTER — Telehealth: Payer: Self-pay | Admitting: *Deleted

## 2016-03-14 NOTE — Telephone Encounter (Signed)
Received PA request for Bupropion XL 150 mg tablet; initiated via [employer], awaiting response/SLS 03/22

## 2016-07-25 ENCOUNTER — Encounter: Payer: Self-pay | Admitting: Family Medicine

## 2016-07-25 ENCOUNTER — Ambulatory Visit (INDEPENDENT_AMBULATORY_CARE_PROVIDER_SITE_OTHER): Payer: Managed Care, Other (non HMO) | Admitting: Family Medicine

## 2016-07-25 ENCOUNTER — Other Ambulatory Visit: Payer: Self-pay | Admitting: Family Medicine

## 2016-07-25 VITALS — BP 142/92 | HR 74 | Temp 98.0°F | Resp 16 | Ht 66.0 in | Wt 300.4 lb

## 2016-07-25 DIAGNOSIS — I10 Essential (primary) hypertension: Secondary | ICD-10-CM

## 2016-07-25 DIAGNOSIS — R600 Localized edema: Secondary | ICD-10-CM | POA: Diagnosis not present

## 2016-07-25 DIAGNOSIS — R040 Epistaxis: Secondary | ICD-10-CM | POA: Diagnosis not present

## 2016-07-25 DIAGNOSIS — E038 Other specified hypothyroidism: Secondary | ICD-10-CM | POA: Diagnosis not present

## 2016-07-25 LAB — CBC WITH DIFFERENTIAL/PLATELET
BASOS ABS: 0 {cells}/uL (ref 0–200)
Basophils Relative: 0 %
EOS ABS: 140 {cells}/uL (ref 15–500)
EOS PCT: 2 %
HCT: 40.7 % (ref 35.0–45.0)
Hemoglobin: 13.6 g/dL (ref 11.7–15.5)
LYMPHS PCT: 29 %
Lymphs Abs: 2030 cells/uL (ref 850–3900)
MCH: 30.1 pg (ref 27.0–33.0)
MCHC: 33.4 g/dL (ref 32.0–36.0)
MCV: 90 fL (ref 80.0–100.0)
MONOS PCT: 6 %
MPV: 10.3 fL (ref 7.5–12.5)
Monocytes Absolute: 420 cells/uL (ref 200–950)
NEUTROS PCT: 63 %
Neutro Abs: 4410 cells/uL (ref 1500–7800)
PLATELETS: 240 10*3/uL (ref 140–400)
RBC: 4.52 MIL/uL (ref 3.80–5.10)
RDW: 13.7 % (ref 11.0–15.0)
WBC: 7 10*3/uL (ref 3.8–10.8)

## 2016-07-25 LAB — TSH: TSH: 3.77 m[IU]/L

## 2016-07-25 MED ORDER — HYDROCODONE-ACETAMINOPHEN 5-325 MG PO TABS: 1.0000 | ORAL_TABLET | Freq: Four times a day (QID) | ORAL | 0 refills | Status: DC | PRN

## 2016-07-25 MED ORDER — CITALOPRAM HYDROBROMIDE 20 MG PO TABS: 20.0000 mg | ORAL_TABLET | Freq: Every day | ORAL | 3 refills | Status: DC

## 2016-07-25 MED ORDER — BUPROPION HCL ER (XL) 150 MG PO TB24: 150.0000 mg | ORAL_TABLET | Freq: Every day | ORAL | 3 refills | Status: DC

## 2016-07-25 MED ORDER — CYCLOBENZAPRINE HCL 10 MG PO TABS: 10.0000 mg | ORAL_TABLET | Freq: Three times a day (TID) | ORAL | 0 refills | Status: DC | PRN

## 2016-07-25 MED ORDER — HYDROCHLOROTHIAZIDE 12.5 MG PO TABS: 12.5000 mg | ORAL_TABLET | Freq: Every day | ORAL | 3 refills | Status: DC

## 2016-07-25 NOTE — Patient Instructions (Signed)
Follow up in 3-4 weeks to recheck swelling and BP We'll notify you of your lab results and make any changes if needed Continue to work on low salt diet and regular exercise Drink plenty of fluids Start the HCTZ daily for the swelling Start daily Claritin or Zyrtec to help w/ the allergy/sinus congestion and nose bleeds Call with any questions or concerns Hang in there!!!

## 2016-07-25 NOTE — Assessment & Plan Note (Signed)
Chronic problem.  Pt now w/ excessive fatigue and 15 lb weight gain in 2 months.  Check labs and adjust medication prn.  Pt expressed understanding and is in agreement w/ plan.

## 2016-07-25 NOTE — Progress Notes (Signed)
Pre visit review using our clinic review tool, if applicable. No additional management support is needed unless otherwise documented below in the visit note. 

## 2016-07-25 NOTE — Assessment & Plan Note (Signed)
New.  May be related to heat and recent weight gain.  May be thyroid related.  Check labs.  Start HCTZ daily.  Discussed need for low salt diet, increased water intake, regular exercise.  Will follow.

## 2016-07-25 NOTE — Assessment & Plan Note (Signed)
Recurrent problem for pt.  Suspect this is due to seasonal allergies.  Encouraged daily Claritin or Zyrtec.  Increase fluid intake.  Reviewed supportive care and red flags that should prompt return.  Pt expressed understanding and is in agreement w/ plan.

## 2016-07-25 NOTE — Progress Notes (Signed)
   Subjective:    Patient ID: Monique Gross, female    DOB: [birth year], 45 y.o.   MRN: [id number]  HPI Edema- pt reports feet have been swelling x2 weeks.  Pt has gained 16 lbs since last visit and she feels most of that has been in last 2 months.  She denies changes to eating habits.  Denies increased salt intake.  Is eating out but no more than usual.  Pt reports she drinks plenty of water daily.  Feet get tight and painful when swollen.  Last night feet improved somewhat.  No CP, SOB above baseline.  Mild finger swelling.  Took MIL's Lasix on Friday w/o improvement.  Elevated BP- pt reports HA today.  Took 2 Excedrin migraine earlier.  Nose bleeds- x2.  Bleeding was short-lived, but brisk.  No pain at the time.  Last occurred 6 days ago.   Review of Systems For ROS see HPI     Objective:   Physical Exam  Constitutional: She is oriented to person, place, and time. She appears well-developed and well-nourished. No distress.  obese  HENT:  Head: Normocephalic and atraumatic.  Nose: Nose normal.  Mouth/Throat: Oropharynx is clear and moist.  Eyes: Conjunctivae and EOM are normal. Pupils are equal, round, and reactive to light.  Neck: Normal range of motion. Neck supple. No thyromegaly present.  Cardiovascular: Normal rate, regular rhythm, normal heart sounds and intact distal pulses.   No murmur heard. Pulmonary/Chest: Effort normal and breath sounds normal. No respiratory distress.  Abdominal: Soft. She exhibits no distension. There is no tenderness.  Musculoskeletal: She exhibits edema (trace LE edema bilaterally).  Lymphadenopathy:    She has no cervical adenopathy.  Neurological: She is alert and oriented to person, place, and time.  Skin: Skin is warm and dry.  Psychiatric: She has a normal mood and affect. Her behavior is normal.  Vitals reviewed.         Assessment & Plan:

## 2016-07-25 NOTE — Assessment & Plan Note (Signed)
Deteriorated.  BP is again elevated and pt has a HA (difficult to say which came first).  Will start HCTZ as pt has swelling and elevated BP.  Stressed need for healthy diet and regular exercise to improve BP and also reduce weight.  Will follow closely.

## 2016-07-26 LAB — BASIC METABOLIC PANEL
BUN: 11 mg/dL (ref 7–25)
CALCIUM: 8.7 mg/dL (ref 8.6–10.2)
CO2: 24 mmol/L (ref 20–31)
CREATININE: 0.82 mg/dL (ref 0.50–1.10)
Chloride: 104 mmol/L (ref 98–110)
GLUCOSE: 172 mg/dL — AB (ref 65–99)
Potassium: 3.7 mmol/L (ref 3.5–5.3)
SODIUM: 139 mmol/L (ref 135–146)

## 2016-07-27 LAB — HEMOGLOBIN A1C
HEMOGLOBIN A1C: 5.8 % — AB (ref <5.7)
MEAN PLASMA GLUCOSE: 120 mg/dL

## 2016-07-30 ENCOUNTER — Encounter: Payer: Self-pay | Admitting: General Practice

## 2016-09-05 ENCOUNTER — Ambulatory Visit (INDEPENDENT_AMBULATORY_CARE_PROVIDER_SITE_OTHER): Payer: Managed Care, Other (non HMO) | Admitting: Family Medicine

## 2016-09-05 ENCOUNTER — Encounter: Payer: Self-pay | Admitting: Family Medicine

## 2016-09-05 VITALS — BP 136/82 | HR 92 | Temp 98.7°F | Resp 16 | Ht 66.0 in | Wt 296.4 lb

## 2016-09-05 DIAGNOSIS — J0100 Acute maxillary sinusitis, unspecified: Secondary | ICD-10-CM | POA: Diagnosis not present

## 2016-09-05 MED ORDER — AMOXICILLIN 875 MG PO TABS
875.0000 mg | ORAL_TABLET | Freq: Two times a day (BID) | ORAL | 0 refills | Status: DC
Start: 2016-09-05 — End: 2016-09-12

## 2016-09-05 NOTE — Progress Notes (Signed)
   Subjective:    Patient ID: Monique Gross, female    DOB: [birth year], 45 y.o.   MRN: [id number]  HPI URI- sxs started 'a couple of weeks ago'.  Pain has been worsening and today is 'facial pain is unbearable'.  + nasal congestion.  Bilateral ear fullness, L>R.  No fevers.  Mild cough.  No known sick contacts.   Review of Systems For ROS see HPI     Objective:   Physical Exam  Constitutional: She is oriented to person, place, and time. She appears well-developed and well-nourished. No distress.  HENT:  Head: Normocephalic and atraumatic.  Right Ear: Tympanic membrane normal.  Left Ear: Tympanic membrane normal.  Nose: Mucosal edema and rhinorrhea present. Right sinus exhibits maxillary sinus tenderness and frontal sinus tenderness. Left sinus exhibits maxillary sinus tenderness and frontal sinus tenderness.  Mouth/Throat: Uvula is midline and mucous membranes are normal. Posterior oropharyngeal erythema present. No oropharyngeal exudate.  Eyes: Conjunctivae and EOM are normal. Pupils are equal, round, and reactive to light.  Neck: Normal range of motion. Neck supple.  Cardiovascular: Normal rate, regular rhythm and normal heart sounds.   Pulmonary/Chest: Effort normal and breath sounds normal. No respiratory distress. She has no wheezes.  Lymphadenopathy:    She has no cervical adenopathy.  Neurological: She is alert and oriented to person, place, and time.  Skin: Skin is warm and dry.  Psychiatric: She has a normal mood and affect. Her behavior is normal. Thought content normal.  Vitals reviewed.         Assessment & Plan:

## 2016-09-05 NOTE — Progress Notes (Signed)
Pre visit review using our clinic review tool, if applicable. No additional management support is needed unless otherwise documented below in the visit note. 

## 2016-09-05 NOTE — Assessment & Plan Note (Signed)
Pt's sxs and PE consistent w/ infxn.  Start abx.  Reviewed supportive care and red flags that should prompt return.  Pt expressed understanding and is in agreement w/ plan.  

## 2016-09-05 NOTE — Patient Instructions (Signed)
Follow up as needed Start the Amoxicillin twice daily- take w/ food Drink plenty of fluids Mucinex DM for cough/congestion REST!! Call with any questions or concerns Hang in there!!!

## 2016-09-06 ENCOUNTER — Ambulatory Visit: Payer: Managed Care, Other (non HMO) | Admitting: Family Medicine

## 2016-09-12 ENCOUNTER — Telehealth: Payer: Self-pay | Admitting: Emergency Medicine

## 2016-09-12 ENCOUNTER — Other Ambulatory Visit: Payer: Self-pay | Admitting: Family Medicine

## 2016-09-12 MED ORDER — CLARITHROMYCIN 500 MG PO TABS: 500.0000 mg | ORAL_TABLET | Freq: Two times a day (BID) | ORAL | 0 refills | Status: AC

## 2016-09-12 NOTE — Telephone Encounter (Signed)
Patient called back stating her symptoms have only improved by 25 %. She was seen on 09/05/16. She has one more day of the Amoxicillin bid. Patient states she was advised if her symptoms has not improved to notify the office for a stronger antibiotic. Please advise. Patient would like a message with the next steps.

## 2016-09-12 NOTE — Telephone Encounter (Signed)
Ok to switch to Biaxin 500mg  BID x10 days

## 2016-09-12 NOTE — Telephone Encounter (Signed)
Left message on machine notify patient per KT changed antibiotic from Amoxicillin to Biaxin for sinus infection. Medication sent to the [facility] in [place].

## 2016-11-06 ENCOUNTER — Encounter: Payer: Self-pay | Admitting: Family Medicine

## 2016-11-06 ENCOUNTER — Ambulatory Visit (INDEPENDENT_AMBULATORY_CARE_PROVIDER_SITE_OTHER): Payer: Managed Care, Other (non HMO) | Admitting: Family Medicine

## 2016-11-06 VITALS — BP 132/81 | HR 81 | Temp 98.2°F | Resp 16 | Ht 66.0 in | Wt 289.0 lb

## 2016-11-06 DIAGNOSIS — R21 Rash and other nonspecific skin eruption: Secondary | ICD-10-CM | POA: Diagnosis not present

## 2016-11-06 MED ORDER — METHYLPREDNISOLONE ACETATE 80 MG/ML IJ SUSP
80.0000 mg | Freq: Once | INTRAMUSCULAR | Status: AC
  Administered 2016-11-06: 80 mg via INTRAMUSCULAR

## 2016-11-06 MED ORDER — PREDNISONE 10 MG PO TABS: ORAL_TABLET | ORAL | 0 refills | Status: DC

## 2016-11-06 MED ORDER — TRIAMCINOLONE ACETONIDE 0.1 % EX OINT: 1.0000 "application " | TOPICAL_OINTMENT | Freq: Two times a day (BID) | CUTANEOUS | 1 refills | Status: DC

## 2016-11-06 NOTE — Patient Instructions (Signed)
Follow up as needed- particularly if not improving! Start the Prednisone tomorrow- 3 pills at the same time for 3 days, then 2 pills for 3 days, then 1 pill for 3 days Start daily Claritin or Zyrtec to help control the itching and then Benadryl before bed Use the Triamcinolone ointment on the itchy areas twice daily Call with any questions or concerns Hang in there!!!

## 2016-11-06 NOTE — Progress Notes (Signed)
   Subjective:    Patient ID: Monique Gross, female    DOB: 07/12/1971, 45 y.o.   MRN: [id number]  HPI Rash- sxs started yesterday on back of neck, 'i felt welts'.  Pt reports areas have spread rapidly and in a linear fashion down neck, onto chest and breasts, abdomen, groin, back.  Very itchy- 'on fire'.  No new detergents, soaps, lotions.  No recent yard work or outdoor exposures.  Does have 2 dogs.  No one at home w/ similar rashes.   No neighbors were burning leaves this weekend.  Pt has not slept anywhere new or different.  No rashes on hands/feet.   Review of Systems For ROS see HPI     Objective:   Physical Exam  Constitutional: She is oriented to person, place, and time. She appears well-developed and well-nourished. No distress.  HENT:  Head: Normocephalic and atraumatic.  Neurological: She is alert and oriented to person, place, and time.  Skin: Skin is warm and dry. No rash (erythematous, itchy rash in linear streaks on neck, chest, abd, back, groin.  blanches w/ presure) noted. There is erythema.  Psychiatric: She has a normal mood and affect. Her behavior is normal. Thought content normal.  Vitals reviewed.         Assessment & Plan:  Rash- pt's rash is consistent w/ some form of contact dermatitis.  No obvious cause.  Depo-medrol given today in office.  Start Prednisone taper.  Topical steroid ointment.  Reviewed supportive care and red flags that should prompt return.  Pt expressed understanding and is in agreement w/ plan.

## 2016-11-06 NOTE — Progress Notes (Signed)
Pre visit review using our clinic review tool, if applicable. No additional management support is needed unless otherwise documented below in the visit note. 

## 2016-11-07 ENCOUNTER — Ambulatory Visit (INDEPENDENT_AMBULATORY_CARE_PROVIDER_SITE_OTHER): Payer: Managed Care, Other (non HMO) | Admitting: Family Medicine

## 2016-11-07 ENCOUNTER — Telehealth: Payer: Self-pay | Admitting: Family Medicine

## 2016-11-07 ENCOUNTER — Other Ambulatory Visit: Payer: Self-pay | Admitting: Family Medicine

## 2016-11-07 DIAGNOSIS — R21 Rash and other nonspecific skin eruption: Secondary | ICD-10-CM

## 2016-11-07 MED ORDER — METHYLPREDNISOLONE ACETATE 80 MG/ML IJ SUSP
80.0000 mg | Freq: Once | INTRAMUSCULAR | Status: AC
  Administered 2016-11-07: 80 mg via INTRAMUSCULAR

## 2016-11-07 NOTE — Assessment & Plan Note (Signed)
Deteriorated.  Spreading to new areas despite steroids.  Reviewed appropriate way to take the steroid taper since pt did this incorrectly today.  Given the spread, will refer to derm for complete evaluation and tx.  Depomedrol given before pt left office.  Pt expressed understanding and is in agreement w/ plan.

## 2016-11-07 NOTE — Progress Notes (Signed)
   Subjective:    Patient ID: Monique Gross, female    DOB: 1971-04-24, 45 y.o.   MRN: [id number]  HPI Rash- pt's rash has now spread to her forehead, scalp, and lower legs.  Continues to be very itchy.  Some of the linear areas on back, trunk are now less red and confluent than yesterday but the new areas are concerning.  She did not take her prednisone as directed- only took 1 tab rather than 3 tabs today.     Review of Systems For ROS see HPI     Objective:   Physical Exam  Constitutional: She is oriented to person, place, and time. She appears well-developed and well-nourished. No distress.  Neurological: She is alert and oriented to person, place, and time.  Skin: Skin is warm and dry. Rash (less confluent linear distribution today on back and trunk but new areas on face, scalp, and lower legs) noted. There is erythema.  Vitals reviewed.         Assessment & Plan:

## 2016-11-07 NOTE — Telephone Encounter (Signed)
Called pt and was advised that she took the prednisone about an hour ago. She will call back at lunch to inform how her symptoms are, and we will discuss at that time the next steps.

## 2016-11-07 NOTE — Telephone Encounter (Signed)
Pt called back today and advised that the rash has stabilized but she has new onset spots on her face and scalp. Per PCP stat referral placed to Dermatology and pt advised to come out to office for additional depo-medrol injection.

## 2016-11-07 NOTE — Telephone Encounter (Signed)
Patient states she was seen in the office yesterday for generalized whelps and rash on her body.  She states it was much worse this morning when she woke up.  She did not expect it to go away over night.  However, she is concerned because it has gotten worse.   Please return call to patient at work as her cell phone does not pick up a signal at her job.  Work # [phone], ext [phone].

## 2016-11-07 NOTE — Telephone Encounter (Signed)
Did pt take her prednisone today?  She needs to make sure she has done that.  Monitor for any improvement/worsening by lunch and if getting worse, will need to return for another Depo Medrol injxn this afternoon

## 2016-11-08 ENCOUNTER — Telehealth: Payer: Self-pay | Admitting: Family Medicine

## 2016-11-08 NOTE — Telephone Encounter (Signed)
Called pt and left a detailed message to advise.  

## 2016-11-08 NOTE — Telephone Encounter (Signed)
Please advise pt seen dermatology at 3:45 yesterday.

## 2016-11-08 NOTE — Telephone Encounter (Signed)
I suspect the swelling is due to fluid retention from the depo-medrol injections the last 2 days

## 2016-11-08 NOTE — Telephone Encounter (Signed)
Pt states that she has been in to see KT twice this week for a rash and now her legs are swelling, pt states that she is unsure what to do.

## 2016-11-08 NOTE — Telephone Encounter (Signed)
Pt can be reached at work at ext [phone]

## 2016-11-14 ENCOUNTER — Encounter: Payer: Self-pay | Admitting: Gastroenterology

## 2016-11-24 ENCOUNTER — Other Ambulatory Visit: Payer: Self-pay | Admitting: Family Medicine

## 2016-11-26 ENCOUNTER — Other Ambulatory Visit: Payer: Self-pay | Admitting: General Practice

## 2016-11-26 MED ORDER — BUPROPION HCL ER (XL) 150 MG PO TB24: 150.0000 mg | ORAL_TABLET | Freq: Every day | ORAL | 0 refills | Status: DC

## 2017-01-08 ENCOUNTER — Encounter: Payer: Self-pay | Admitting: Family Medicine

## 2017-01-08 ENCOUNTER — Ambulatory Visit (INDEPENDENT_AMBULATORY_CARE_PROVIDER_SITE_OTHER): Payer: Managed Care, Other (non HMO) | Admitting: Family Medicine

## 2017-01-08 VITALS — BP 132/82 | HR 90 | Temp 97.8°F | Resp 17 | Ht 66.0 in | Wt 281.1 lb

## 2017-01-08 DIAGNOSIS — R197 Diarrhea, unspecified: Secondary | ICD-10-CM | POA: Diagnosis not present

## 2017-01-08 LAB — HEPATIC FUNCTION PANEL
ALK PHOS: 53 U/L (ref 39–117)
ALT: 32 U/L (ref 0–35)
AST: 18 U/L (ref 0–37)
Albumin: 3.8 g/dL (ref 3.5–5.2)
Bilirubin, Direct: 0.1 mg/dL (ref 0.0–0.3)
TOTAL PROTEIN: 6 g/dL (ref 6.0–8.3)
Total Bilirubin: 0.4 mg/dL (ref 0.2–1.2)

## 2017-01-08 LAB — CBC WITH DIFFERENTIAL/PLATELET
Basophils Absolute: 0 10*3/uL (ref 0.0–0.1)
Basophils Relative: 0.5 % (ref 0.0–3.0)
EOS PCT: 0.9 % (ref 0.0–5.0)
Eosinophils Absolute: 0.1 10*3/uL (ref 0.0–0.7)
HCT: 39.2 % (ref 36.0–46.0)
Hemoglobin: 13.3 g/dL (ref 12.0–15.0)
LYMPHS ABS: 1.2 10*3/uL (ref 0.7–4.0)
Lymphocytes Relative: 16 % (ref 12.0–46.0)
MCHC: 34 g/dL (ref 30.0–36.0)
MCV: 89.7 fl (ref 78.0–100.0)
MONOS PCT: 4.2 % (ref 3.0–12.0)
Monocytes Absolute: 0.3 10*3/uL (ref 0.1–1.0)
NEUTROS PCT: 78.4 % — AB (ref 43.0–77.0)
Neutro Abs: 5.7 10*3/uL (ref 1.4–7.7)
PLATELETS: 195 10*3/uL (ref 150.0–400.0)
RBC: 4.37 Mil/uL (ref 3.87–5.11)
RDW: 13.6 % (ref 11.5–15.5)
WBC: 7.2 10*3/uL (ref 4.0–10.5)

## 2017-01-08 LAB — BASIC METABOLIC PANEL
BUN: 9 mg/dL (ref 6–23)
CALCIUM: 8.9 mg/dL (ref 8.4–10.5)
CO2: 28 meq/L (ref 19–32)
Chloride: 107 mEq/L (ref 96–112)
Creatinine, Ser: 0.75 mg/dL (ref 0.40–1.20)
GFR: 88.3 mL/min (ref >60.00)
Glucose, Bld: 147 mg/dL — ABNORMAL HIGH (ref 70–99)
POTASSIUM: 3.8 meq/L (ref 3.5–5.1)
SODIUM: 141 meq/L (ref 135–145)

## 2017-01-08 NOTE — Progress Notes (Signed)
   Subjective:    Patient ID: Monique Gross, female    DOB: 10-02-1971, 46 y.o.   MRN: [id number]  HPI GI bug- sxs started Jan w/ 'violent vomiting'.  Had fever and chills so she stayed out of work 1/2.  Pt reports she is still not able to 'eat anything' b/c she continues to have diarrhea after eating w/ severe abd cramping.  Pt has lost 8 lbs in 2 weeks.  No recent abx.  Nausea is improving.  Pt will have diarrhea anywhere from 1-5x/day.  Stools are either very loose or watery.  No blood.  No relief w/ Pepto this weekend.  Pt is usually IBS-C.  Pt started Probiotics 4 days ago.     Review of Systems For ROS see HPI     Objective:   Physical Exam  Constitutional: She is oriented to person, place, and time. She appears well-developed and well-nourished. No distress.  HENT:  Head: Normocephalic and atraumatic.  MMM  Neck: Neck supple.  Cardiovascular: Normal rate, regular rhythm and intact distal pulses.   Pulmonary/Chest: Effort normal and breath sounds normal. No respiratory distress. She has no wheezes. She has no rales.  Abdominal: Soft. She exhibits no distension. There is no tenderness. There is no rebound.  Hyperactive BS  Lymphadenopathy:    She has no cervical adenopathy.  Neurological: She is alert and oriented to person, place, and time.  Skin: Skin is warm and dry.  Vitals reviewed.         Assessment & Plan:  Diarrhea- new.  Pt's duration of sxs is concerning for C diff.  Check labs.  May also be her IBS switching from constipation to diarrhea but need to r/o infectious causes.  Pt already on Probiotics- which she could continue.  Reviewed supportive care and red flags that should prompt return.  Pt expressed understanding and is in agreement w/ plan.

## 2017-01-08 NOTE — Patient Instructions (Signed)
Follow up as needed/scheduled We'll notify you of your lab results and make any changes if needed Complete the stool studies as directed and return For severe abdominal cramping- take 1 immodium Drink plenty of fluids Call with any questions or concerns Hang in there!!!

## 2017-01-08 NOTE — Progress Notes (Signed)
Pre visit review using our clinic review tool, if applicable. No additional management support is needed unless otherwise documented below in the visit note. 

## 2017-01-11 ENCOUNTER — Telehealth: Payer: Self-pay | Admitting: *Deleted

## 2017-01-11 ENCOUNTER — Other Ambulatory Visit: Payer: Self-pay | Admitting: Family Medicine

## 2017-01-11 NOTE — Telephone Encounter (Signed)
At this time, she needs a GI referral for diarrhea.  Please clarify with her that the abdominal pain is still only occurring with the diarrhea.  If the pain is now occurring independently of the diarrhea, she needs to be evaluated for other issues (diverticulitis, etc)

## 2017-01-11 NOTE — Telephone Encounter (Signed)
While going through lab results, patient stated that she is still having a lot of pain in her stomach and still having diarrhea.   She said that her stomach feels like its just in knots and nothing is helping.   She has been taking imodium and this has not helped.  She is asking if there is anything else that she can do/be given to help her.       She will be taking her Stool sample to the [place] office today for them to send off so that she does not have to drive all the way to [place].

## 2017-01-11 NOTE — Telephone Encounter (Signed)
Left message for patient to call back to discuss.

## 2017-01-12 LAB — C. DIFFICILE GDH AND TOXIN A/B
C. DIFF TOXIN A/B: NOT DETECTED
C. DIFFICILE GDH: NOT DETECTED

## 2017-01-15 LAB — STOOL CULTURE

## 2017-01-15 NOTE — Telephone Encounter (Signed)
Patient states that the symptoms are much better and she does not feel the need to see GI at this time. She is aware if this occurs again to call us.

## 2017-02-22 ENCOUNTER — Other Ambulatory Visit: Payer: Self-pay | Admitting: Family Medicine

## 2017-06-05 ENCOUNTER — Other Ambulatory Visit: Payer: Self-pay | Admitting: Family Medicine

## 2017-07-11 ENCOUNTER — Ambulatory Visit (INDEPENDENT_AMBULATORY_CARE_PROVIDER_SITE_OTHER): Payer: Managed Care, Other (non HMO) | Admitting: Family Medicine

## 2017-07-11 ENCOUNTER — Encounter: Payer: Self-pay | Admitting: Family Medicine

## 2017-07-11 VITALS — BP 133/83 | HR 69 | Temp 99.1°F | Resp 16 | Ht 66.0 in | Wt 291.4 lb

## 2017-07-11 DIAGNOSIS — M2550 Pain in unspecified joint: Secondary | ICD-10-CM | POA: Diagnosis not present

## 2017-07-11 DIAGNOSIS — R0982 Postnasal drip: Secondary | ICD-10-CM

## 2017-07-11 DIAGNOSIS — R8290 Unspecified abnormal findings in urine: Secondary | ICD-10-CM | POA: Diagnosis not present

## 2017-07-11 LAB — TSH: TSH: 6.2 u[IU]/mL — ABNORMAL HIGH (ref 0.35–4.50)

## 2017-07-11 LAB — CBC WITH DIFFERENTIAL/PLATELET
BASOS ABS: 0.1 10*3/uL (ref 0.0–0.1)
Basophils Relative: 0.9 % (ref 0.0–3.0)
EOS PCT: 0.8 % (ref 0.0–5.0)
Eosinophils Absolute: 0.1 10*3/uL (ref 0.0–0.7)
HCT: 42 % (ref 36.0–46.0)
Hemoglobin: 13.9 g/dL (ref 12.0–15.0)
Lymphocytes Relative: 22.3 % (ref 12.0–46.0)
Lymphs Abs: 1.8 10*3/uL (ref 0.7–4.0)
MCHC: 33.1 g/dL (ref 30.0–36.0)
MCV: 92.7 fl (ref 78.0–100.0)
MONO ABS: 0.5 10*3/uL (ref 0.1–1.0)
Monocytes Relative: 6.4 % (ref 3.0–12.0)
NEUTROS ABS: 5.7 10*3/uL (ref 1.4–7.7)
Neutrophils Relative %: 69.6 % (ref 43.0–77.0)
PLATELETS: 224 10*3/uL (ref 150.0–400.0)
RBC: 4.52 Mil/uL (ref 3.87–5.11)
RDW: 14 % (ref 11.5–15.5)
WBC: 8.2 10*3/uL (ref 4.0–10.5)

## 2017-07-11 LAB — BASIC METABOLIC PANEL
BUN: 17 mg/dL (ref 6–23)
CALCIUM: 9.3 mg/dL (ref 8.4–10.5)
CO2: 31 meq/L (ref 19–32)
Chloride: 103 mEq/L (ref 96–112)
Creatinine, Ser: 0.87 mg/dL (ref 0.40–1.20)
GFR: 74.24 mL/min (ref >60.00)
Glucose, Bld: 92 mg/dL (ref 70–99)
Potassium: 4.2 mEq/L (ref 3.5–5.1)
SODIUM: 140 meq/L (ref 135–145)

## 2017-07-11 LAB — POCT URINALYSIS DIPSTICK
BILIRUBIN UA: NEGATIVE
Blood, UA: NEGATIVE
Glucose, UA: NEGATIVE
Ketones, UA: NEGATIVE
LEUKOCYTES UA: NEGATIVE
Nitrite, UA: NEGATIVE
PH UA: 6.5 (ref 5.0–8.0)
PROTEIN UA: NEGATIVE
Spec Grav, UA: 1.02 (ref 1.010–1.025)
UROBILINOGEN UA: 0.2 U/dL

## 2017-07-11 LAB — SEDIMENTATION RATE: SED RATE: 19 mm/h (ref 0–20)

## 2017-07-11 LAB — VITAMIN D 25 HYDROXY (VIT D DEFICIENCY, FRACTURES): VITD: 28.76 ng/mL — AB (ref 30.00–100.00)

## 2017-07-11 MED ORDER — CETIRIZINE HCL 10 MG PO TABS: 10.0000 mg | ORAL_TABLET | Freq: Every day | ORAL | 11 refills | Status: DC

## 2017-07-11 MED ORDER — MELOXICAM 15 MG PO TABS: 15.0000 mg | ORAL_TABLET | Freq: Every day | ORAL | 1 refills | Status: DC

## 2017-07-11 NOTE — Progress Notes (Signed)
Pre visit review using our clinic review tool, if applicable. No additional management support is needed unless otherwise documented below in the visit note. 

## 2017-07-11 NOTE — Progress Notes (Signed)
   Subjective:    Patient ID: Monique Gross, female    DOB: 1971-12-22, 46 y.o.   MRN: [id number]  HPI Joint pain- sxs started 'a couple of months ago' w/ increased back pain.  Pain progressed from lower back to knees bilaterally and then hips.  + fatigue.  Saw Dr Vertell Limber a few weeks ago and xrays show 'my back is eaten up w/ arthritis'.  'i know something's wrong'.  Not currently taking anything for pain.  Pt has gained 10 lbs recently  Cloudy urine- pt is concerned for possible UTI.  Constant clearing of throat   Review of Systems For ROS see HPI     Objective:   Physical Exam  Constitutional: She is oriented to person, place, and time. She appears well-developed and well-nourished. No distress.  Morbidly obese  HENT:  Head: Normocephalic and atraumatic.  Mouth/Throat: Oropharynx is clear and moist.  + PND  Musculoskeletal: She exhibits tenderness (TTP over multiple trigger points- chest wall, upper arms bilaterally, thighs bilaterally, lower legss bilaterally). She exhibits no edema.  Neurological: She is alert and oriented to person, place, and time.  Skin: Skin is warm and dry. No rash noted. No erythema.  Psychiatric: She has a normal mood and affect. Her behavior is normal. Thought content normal.  Vitals reviewed.         Assessment & Plan:  Polyarthralgia- new.  Pt has hx of back problems (sees Dr Vertell Limber) but recently has had much more difficulty w/ knee and hip pain.  This could be due to her recent weight gain but must r/o rheumatologic processes.  Also, given TTP over multiple trigger points, increased fatigue, and depressed mood- must consider fibromyalgia.  Check labs.  Start daily NSAID.  If labs are normal, will switch Celexa to Cymbalta for pain control.  Cloudy urine- denies dysuria or frequency but will get UA to assess for UTI.  Will tx accordingly  PND- pt complains of constantly clearing her throat and she has copious PND.  Will start daily antihistamine and  if no improvement, will add H2 blocker.  Reviewed supportive care and red flags that should prompt return.  Pt expressed understanding and is in agreement w/ plan.

## 2017-07-11 NOTE — Patient Instructions (Signed)
Follow up in 4-6 weeks to recheck pain We'll notify you of your lab results and make any changes if needed Start the Meloxicam once daily- take w/ food If the labs show any rheumatologic process- we'll refer you If the labs are negative, we'll switch the Citalopram for Cymbalta for possible fibromyalgia Continue to drink plenty of water Call with any questions or concerns Hang in there!!!

## 2017-07-12 ENCOUNTER — Other Ambulatory Visit: Payer: Self-pay | Admitting: General Practice

## 2017-07-12 LAB — RHEUMATOID FACTOR: Rhuematoid fact SerPl-aCnc: <14 IU/mL (ref <14)

## 2017-07-12 LAB — ANA: Anti Nuclear Antibody(ANA): NEGATIVE

## 2017-07-12 MED ORDER — LEVOTHYROXINE SODIUM 100 MCG PO TABS: 100.0000 ug | ORAL_TABLET | Freq: Every day | ORAL | 3 refills | Status: DC

## 2017-07-17 ENCOUNTER — Encounter: Payer: Self-pay | Admitting: Family Medicine

## 2017-07-17 MED ORDER — LEVOTHYROXINE SODIUM 100 MCG PO TABS: 100.0000 ug | ORAL_TABLET | Freq: Every day | ORAL | 3 refills | Status: DC

## 2017-07-22 ENCOUNTER — Encounter: Payer: Self-pay | Admitting: Gastroenterology

## 2017-08-15 ENCOUNTER — Ambulatory Visit (INDEPENDENT_AMBULATORY_CARE_PROVIDER_SITE_OTHER): Payer: Managed Care, Other (non HMO) | Admitting: Family Medicine

## 2017-08-15 ENCOUNTER — Encounter: Payer: Self-pay | Admitting: Family Medicine

## 2017-08-15 VITALS — BP 129/89 | HR 76 | Temp 98.1°F | Resp 16 | Ht 66.0 in | Wt 288.0 lb

## 2017-08-15 DIAGNOSIS — Z1231 Encounter for screening mammogram for malignant neoplasm of breast: Secondary | ICD-10-CM | POA: Diagnosis not present

## 2017-08-15 DIAGNOSIS — M2550 Pain in unspecified joint: Secondary | ICD-10-CM | POA: Diagnosis not present

## 2017-08-15 DIAGNOSIS — R21 Rash and other nonspecific skin eruption: Secondary | ICD-10-CM | POA: Diagnosis not present

## 2017-08-15 MED ORDER — TRIAMCINOLONE ACETONIDE 0.1 % EX OINT: 1.0000 "application " | TOPICAL_OINTMENT | Freq: Two times a day (BID) | CUTANEOUS | 1 refills | Status: AC

## 2017-08-15 MED ORDER — DULOXETINE HCL 30 MG PO CPEP: 30.0000 mg | ORAL_CAPSULE | Freq: Every day | ORAL | 3 refills | Status: DC

## 2017-08-15 NOTE — Progress Notes (Signed)
   Subjective:    Patient ID: Darlina Sicilian, female    DOB: Nov 11, 1971, 46 y.o.   MRN: [id number]  HPI Polyarthralgia- rheumatologic labs were negative at last visit.  Started on Meloxicam.  Pain has improved somewhat.  Plan is to switch Celexa to Cymbalta for additional pain control  Rash- pt has rash on forearms bilaterally that started yesterday.    Overdue for mammo- pt loses insurance next week, needs ASAP.   Review of Systems For ROS see HPI     Objective:   Physical Exam  Constitutional: She is oriented to person, place, and time. She appears well-developed and well-nourished. No distress.  obese  Neurological: She is alert and oriented to person, place, and time.  Skin: Skin is warm and dry. Rash (areas on forearms bilaterally consistent w/ contact dermatitis) noted.  Psychiatric: She has a normal mood and affect. Her behavior is normal. Thought content normal.  Vitals reviewed.         Assessment & Plan:  Polyarthralgia- improved since starting Mobic.  Will switch Celexa to Cymbalta for improved pain control.  Pt expressed understanding and is in agreement w/ plan.   Rash- new.  Consistent w/ contact dermatitis.  Start Triamcinolone.  Reviewed supportive care and red flags that should prompt return.  Pt expressed understanding and is in agreement w/ plan.   Mammo- needed.  Ordered.

## 2017-08-15 NOTE — Progress Notes (Signed)
Pre visit review using our clinic review tool, if applicable. No additional management support is needed unless otherwise documented below in the visit note. 

## 2017-08-15 NOTE — Patient Instructions (Signed)
Schedule your complete physical in 3-4 months STOP the Citalopram START the Cymbalta daily Use the Triamcinolone ointment on the itchy areas twice daily as needed We'll call you with your mammogram appt Call with any questions or concerns GOOD LUCK!!!

## 2017-09-07 ENCOUNTER — Other Ambulatory Visit: Payer: Self-pay | Admitting: Family Medicine

## 2017-10-01 ENCOUNTER — Encounter: Payer: Managed Care, Other (non HMO) | Admitting: Gastroenterology

## 2017-10-25 ENCOUNTER — Encounter: Payer: Self-pay | Admitting: Family Medicine

## 2017-10-25 ENCOUNTER — Encounter: Payer: Self-pay | Admitting: General Practice

## 2017-10-28 MED ORDER — FLUOXETINE HCL 20 MG PO TABS: 20.0000 mg | ORAL_TABLET | Freq: Every day | ORAL | 3 refills | Status: DC

## 2017-10-28 MED ORDER — HYDROCHLOROTHIAZIDE 12.5 MG PO TABS: 12.5000 mg | ORAL_TABLET | Freq: Every day | ORAL | 3 refills | Status: DC

## 2017-10-28 MED ORDER — LEVOTHYROXINE SODIUM 100 MCG PO TABS: 100.0000 ug | ORAL_TABLET | Freq: Every day | ORAL | 3 refills | Status: DC

## 2017-10-29 MED ORDER — FLUOXETINE HCL 20 MG PO CAPS: 20.0000 mg | ORAL_CAPSULE | Freq: Every day | ORAL | 3 refills | Status: DC

## 2017-10-29 NOTE — Addendum Note (Signed)
Addended by: Davis Gourd on: 10/29/2017 09:04 AM   Modules accepted: Orders

## 2018-02-19 ENCOUNTER — Ambulatory Visit: Payer: Self-pay | Admitting: *Deleted

## 2018-02-19 NOTE — Telephone Encounter (Signed)
Noted.  Please call pt and let her know if sxs change or worsen she needs to be seen today or go to the ER.

## 2018-02-19 NOTE — Telephone Encounter (Signed)
Patient has been made aware to seek treatment if there is a change in sxs or her condition worsens. She stated verbal understanding.

## 2018-02-19 NOTE — Telephone Encounter (Signed)
Patient is calling to report that she has just recovered from the flu- she is having chest congestion with tightness when she takes a deep breath.She is complaining of fatigue. She was offered a same day appointment but she prefers to see her PCP and wants to wait until tomorrow. She is presently working.  Reason for Disposition . [1] MILD difficulty breathing (e.g., minimal/no SOB at rest, SOB with walking, pulse <100) AND [2] NEW-onset or WORSE than normal  Answer Assessment - Initial Assessment Questions 1. RESPIRATORY STATUS: "Describe your breathing?" (e.g., wheezing, shortness of breath, unable to speak, severe coughing)      A little SOB with exertion- tightness with deep breath 2. ONSET: "When did this breathing problem begin?"      Saturday or Sunday after the flu 3. PATTERN "Does the difficult breathing come and go, or has it been constant since it started?"      constant 4. SEVERITY: "How bad is your breathing?" (e.g., mild, moderate, severe)    - MILD: No SOB at rest, mild SOB with walking, speaks normally in sentences, can lay down, no retractions, pulse < 100.    - MODERATE: SOB at rest, SOB with minimal exertion and prefers to sit, cannot lie down flat, speaks in phrases, mild retractions, audible wheezing, pulse 100-120.    - SEVERE: Very SOB at rest, speaks in single words, struggling to breathe, sitting hunched forward, retractions, pulse > 120      Mild- moderate 5. RECURRENT SYMPTOM: "Have you had difficulty breathing before?" If so, ask: "When was the last time?" and "What happened that time?"      No- possible bronchitis  6. CARDIAC HISTORY: "Do you have any history of heart disease?" (e.g., heart attack, angina, bypass surgery, angioplasty)      no 7. LUNG HISTORY: "Do you have any history of lung disease?"  (e.g., pulmonary embolus, asthma, emphysema)     no 8. CAUSE: "What do you think is causing the breathing problem?"      bronchitis or pneumonia after flu 9. OTHER  SYMPTOMS: "Do you have any other symptoms? (e.g., dizziness, runny nose, cough, chest pain, fever)     Cough, runny nose,  10. PREGNANCY: "Is there any chance you are pregnant?" "When was your last menstrual period?"       n/a 11. TRAVEL: "Have you traveled out of the country in the last month?" (e.g., travel history, exposures)       no  Protocols used: BREATHING DIFFICULTY-A-AH

## 2018-02-20 ENCOUNTER — Other Ambulatory Visit: Payer: Self-pay

## 2018-02-20 ENCOUNTER — Ambulatory Visit: Payer: Commercial Managed Care - PPO | Admitting: Family Medicine

## 2018-02-20 ENCOUNTER — Encounter: Payer: Self-pay | Admitting: Family Medicine

## 2018-02-20 VITALS — BP 121/83 | HR 82 | Temp 99.0°F | Resp 17 | Ht 66.0 in | Wt 287.2 lb

## 2018-02-20 DIAGNOSIS — B9689 Other specified bacterial agents as the cause of diseases classified elsewhere: Secondary | ICD-10-CM

## 2018-02-20 DIAGNOSIS — J329 Chronic sinusitis, unspecified: Secondary | ICD-10-CM | POA: Diagnosis not present

## 2018-02-20 DIAGNOSIS — R05 Cough: Secondary | ICD-10-CM | POA: Diagnosis not present

## 2018-02-20 DIAGNOSIS — R059 Cough, unspecified: Secondary | ICD-10-CM

## 2018-02-20 DIAGNOSIS — J209 Acute bronchitis, unspecified: Secondary | ICD-10-CM | POA: Diagnosis not present

## 2018-02-20 MED ORDER — AMOXICILLIN 875 MG PO TABS: 875.0000 mg | ORAL_TABLET | Freq: Two times a day (BID) | ORAL | 0 refills | Status: DC

## 2018-02-20 MED ORDER — ALBUTEROL SULFATE (2.5 MG/3ML) 0.083% IN NEBU
2.5000 mg | INHALATION_SOLUTION | Freq: Once | RESPIRATORY_TRACT | Status: AC
  Administered 2018-02-20: 2.5 mg via RESPIRATORY_TRACT

## 2018-02-20 MED ORDER — PROMETHAZINE-DM 6.25-15 MG/5ML PO SYRP: 5.0000 mL | ORAL_SOLUTION | Freq: Four times a day (QID) | ORAL | 0 refills | Status: DC | PRN

## 2018-02-20 MED ORDER — ALBUTEROL SULFATE HFA 108 (90 BASE) MCG/ACT IN AERS: 2.0000 | INHALATION_SPRAY | Freq: Four times a day (QID) | RESPIRATORY_TRACT | 2 refills | Status: DC | PRN

## 2018-02-20 NOTE — Patient Instructions (Signed)
Follow up as needed or as scheduled START the Amoxicillin twice daily- take w/ food Use the cough syrup as needed- may cause drowsiness Mucinex DM for daytime cough w/out drowsiness Drink plenty of fluids REST! Call with any questions or concerns Hang in there!!!

## 2018-02-20 NOTE — Progress Notes (Signed)
   Subjective:    Patient ID: Monique Gross, female    DOB: 02-06-1971, 47 y.o.   MRN: [id number]  HPI Chest tightness- pt tested + for flu last week and developed tightness in chest on Sunday.  At night 'I cough so bad I can't catch my breath'.  + SOB.  Dry cough.  Had sinus pressure Sat-Monday.  No fevers since her flu dx.  + sick contacts.   Review of Systems For ROS see HPI     Objective:   Physical Exam  Constitutional: She appears well-developed and well-nourished. No distress.  HENT:  Head: Normocephalic and atraumatic.  Right Ear: Tympanic membrane normal.  Left Ear: Tympanic membrane normal.  Nose: Mucosal edema and rhinorrhea present. Right sinus exhibits maxillary sinus tenderness and frontal sinus tenderness. Left sinus exhibits maxillary sinus tenderness and frontal sinus tenderness.  Mouth/Throat: Uvula is midline and mucous membranes are normal. Posterior oropharyngeal erythema present. No oropharyngeal exudate.  Eyes: Conjunctivae and EOM are normal. Pupils are equal, round, and reactive to light.  Neck: Normal range of motion. Neck supple.  Cardiovascular: Normal rate, regular rhythm and normal heart sounds.  Pulmonary/Chest: Effort normal and breath sounds normal. No respiratory distress. She has no wheezes. She has no rales.  + hacking cough- improved s/p neb tx  Lymphadenopathy:    She has no cervical adenopathy.          Assessment & Plan:  Sinusitis- new.  Pt's sxs and PE consistent w/ infxn.  Start abx.  Reviewed supportive care and red flags that should prompt return.  Pt expressed understanding and is in agreement w/ plan.   Acute bronchitis w/ bronchospasm- pt's air movement and cough improved s/p neb tx in office.  Start Albuterol HFA and cough meds prn.  Reviewed supportive care and red flags that should prompt return.  Pt expressed understanding and is in agreement w/ plan.

## 2018-03-05 ENCOUNTER — Encounter: Payer: Self-pay | Admitting: Family Medicine

## 2018-03-05 MED ORDER — DOXYCYCLINE HYCLATE 100 MG PO TABS: 100.0000 mg | ORAL_TABLET | Freq: Two times a day (BID) | ORAL | 0 refills | Status: DC

## 2018-05-18 ENCOUNTER — Other Ambulatory Visit: Payer: Self-pay | Admitting: Family Medicine

## 2018-05-21 ENCOUNTER — Other Ambulatory Visit: Payer: Self-pay | Admitting: Family Medicine

## 2018-08-02 ENCOUNTER — Other Ambulatory Visit: Payer: Self-pay | Admitting: Family Medicine

## 2018-10-07 ENCOUNTER — Other Ambulatory Visit: Payer: Self-pay | Admitting: Family Medicine

## 2018-10-07 ENCOUNTER — Encounter: Payer: Self-pay | Admitting: Emergency Medicine

## 2018-10-13 ENCOUNTER — Ambulatory Visit: Payer: Commercial Managed Care - PPO | Admitting: Family Medicine

## 2018-10-21 ENCOUNTER — Other Ambulatory Visit: Payer: Self-pay | Admitting: General Practice

## 2018-10-21 MED ORDER — LEVOTHYROXINE SODIUM 100 MCG PO TABS: 100.0000 ug | ORAL_TABLET | Freq: Every day | ORAL | 3 refills | Status: DC

## 2018-10-22 ENCOUNTER — Encounter: Payer: Self-pay | Admitting: Family Medicine

## 2018-10-22 ENCOUNTER — Other Ambulatory Visit: Payer: Self-pay

## 2018-10-22 ENCOUNTER — Ambulatory Visit: Payer: Self-pay | Admitting: Family Medicine

## 2018-10-22 VITALS — BP 124/86 | HR 86 | Temp 98.4°F | Resp 16 | Ht 66.0 in | Wt 235.4 lb

## 2018-10-22 DIAGNOSIS — M545 Low back pain, unspecified: Secondary | ICD-10-CM

## 2018-10-22 DIAGNOSIS — E669 Obesity, unspecified: Secondary | ICD-10-CM

## 2018-10-22 DIAGNOSIS — F419 Anxiety disorder, unspecified: Secondary | ICD-10-CM | POA: Diagnosis not present

## 2018-10-22 DIAGNOSIS — F32A Depression, unspecified: Secondary | ICD-10-CM

## 2018-10-22 DIAGNOSIS — E038 Other specified hypothyroidism: Secondary | ICD-10-CM | POA: Diagnosis not present

## 2018-10-22 DIAGNOSIS — I10 Essential (primary) hypertension: Secondary | ICD-10-CM

## 2018-10-22 DIAGNOSIS — G8929 Other chronic pain: Secondary | ICD-10-CM

## 2018-10-22 DIAGNOSIS — F329 Major depressive disorder, single episode, unspecified: Secondary | ICD-10-CM

## 2018-10-22 DIAGNOSIS — M549 Dorsalgia, unspecified: Secondary | ICD-10-CM

## 2018-10-22 LAB — BASIC METABOLIC PANEL
BUN: 24 mg/dL — AB (ref 6–23)
CHLORIDE: 104 meq/L (ref 96–112)
CO2: 29 mEq/L (ref 19–32)
CREATININE: 0.87 mg/dL (ref 0.40–1.20)
Calcium: 10.2 mg/dL (ref 8.4–10.5)
GFR: 73.83 mL/min (ref >60.00)
GLUCOSE: 93 mg/dL (ref 70–99)
Potassium: 4 mEq/L (ref 3.5–5.1)
Sodium: 141 mEq/L (ref 135–145)

## 2018-10-22 LAB — HEPATIC FUNCTION PANEL
ALBUMIN: 4.6 g/dL (ref 3.5–5.2)
ALT: 83 U/L — ABNORMAL HIGH (ref 0–35)
AST: 46 U/L — AB (ref 0–37)
Alkaline Phosphatase: 72 U/L (ref 39–117)
Bilirubin, Direct: 0.1 mg/dL (ref 0.0–0.3)
TOTAL PROTEIN: 7.4 g/dL (ref 6.0–8.3)
Total Bilirubin: 0.5 mg/dL (ref 0.2–1.2)

## 2018-10-22 LAB — LIPID PANEL
CHOL/HDL RATIO: 3
Cholesterol: 131 mg/dL (ref 0–200)
HDL: 45 mg/dL (ref >39.00)
LDL CALC: 68 mg/dL (ref 0–99)
NONHDL: 86
Triglycerides: 90 mg/dL (ref 0.0–149.0)
VLDL: 18 mg/dL (ref 0.0–40.0)

## 2018-10-22 LAB — CBC WITH DIFFERENTIAL/PLATELET
BASOS ABS: 0 10*3/uL (ref 0.0–0.1)
Basophils Relative: 0.4 % (ref 0.0–3.0)
Eosinophils Absolute: 0.1 10*3/uL (ref 0.0–0.7)
Eosinophils Relative: 1.5 % (ref 0.0–5.0)
HCT: 45.1 % (ref 36.0–46.0)
HEMOGLOBIN: 15.2 g/dL — AB (ref 12.0–15.0)
LYMPHS ABS: 1 10*3/uL (ref 0.7–4.0)
Lymphocytes Relative: 19.4 % (ref 12.0–46.0)
MCHC: 33.8 g/dL (ref 30.0–36.0)
MCV: 91.7 fl (ref 78.0–100.0)
MONO ABS: 0.4 10*3/uL (ref 0.1–1.0)
Monocytes Relative: 7.1 % (ref 3.0–12.0)
Neutro Abs: 3.7 10*3/uL (ref 1.4–7.7)
Neutrophils Relative %: 71.6 % (ref 43.0–77.0)
Platelets: 164 10*3/uL (ref 150.0–400.0)
RBC: 4.92 Mil/uL (ref 3.87–5.11)
RDW: 13.4 % (ref 11.5–15.5)
WBC: 5.2 10*3/uL (ref 4.0–10.5)

## 2018-10-22 LAB — TSH: TSH: 2.58 u[IU]/mL (ref 0.35–4.50)

## 2018-10-22 MED ORDER — HYDROCODONE-ACETAMINOPHEN 5-325 MG PO TABS: 1.0000 | ORAL_TABLET | Freq: Four times a day (QID) | ORAL | 0 refills | Status: DC | PRN

## 2018-10-22 MED ORDER — CYCLOBENZAPRINE HCL 10 MG PO TABS: 10.0000 mg | ORAL_TABLET | Freq: Three times a day (TID) | ORAL | 1 refills | Status: DC | PRN

## 2018-10-22 NOTE — Assessment & Plan Note (Signed)
Chronic problem.  Currently asymptomatic.  Check labs.  Adjust meds prn  

## 2018-10-22 NOTE — Progress Notes (Signed)
   Subjective:    Patient ID: Monique Gross, female    DOB: [birth year], 47 y.o.   MRN: [id number]  HPI HTN- chronic problem, on HCTZ once daily w/ good control.  No CP, SOB, HAs, visual changes, edema.  Hypothyroid- chronic problem, on Levothyroxine 147mcg daily.  Denies excessive fatigue, changes to skin/hair/nails.  Anxiety/Depression- on Wellbutrin 150mg , Celexa 20mg .  Mood is doing well.  Obesity- pt is down 50 lbs since Feb.  BMI is now 24.  Pt is walking and jogging.  Has changed diet.  Pt is feeling 'really good'.  Back pain- pt had back surgery in 2009.  Dr Vertell Limber gives a once yearly prescription of Hydrocodone and cyclobenzaprine.  Has herniated disc and spinal arthritis.  She is asking me to fill this to save the $75 copay.   Review of Systems For ROS see HPI     Objective:   Physical Exam  Constitutional: She is oriented to person, place, and time. She appears well-developed and well-nourished. No distress.  HENT:  Head: Normocephalic and atraumatic.  Eyes: Pupils are equal, round, and reactive to light. Conjunctivae and EOM are normal.  Neck: Normal range of motion. Neck supple. No thyromegaly present.  Cardiovascular: Normal rate, regular rhythm, normal heart sounds and intact distal pulses.  No murmur heard. Pulmonary/Chest: Effort normal and breath sounds normal. No respiratory distress.  Abdominal: Soft. She exhibits no distension. There is no tenderness.  Musculoskeletal: She exhibits no edema.  Lymphadenopathy:    She has no cervical adenopathy.  Neurological: She is alert and oriented to person, place, and time.  Skin: Skin is warm and dry.  Psychiatric: She has a normal mood and affect. Her behavior is normal.  Vitals reviewed.         Assessment & Plan:

## 2018-10-22 NOTE — Assessment & Plan Note (Signed)
Chronic problem.  Adequate control today.  Asymptomatic.  Check labs.  No anticipated med changes. 

## 2018-10-22 NOTE — Assessment & Plan Note (Signed)
Ongoing issue for pt since back surgery in 2009.  Has been getting a once yearly prescription from Dr Vertell Limber but this requires 1 visit yearly and a $75 copay which she would prefer to avoid if possible.  Will fill 60 Hydrocodone and 60 Flexeril as he has been doing.  Pt appreciative.

## 2018-10-22 NOTE — Patient Instructions (Signed)
Schedule your complete physical in 6 months We'll notify you of your lab results and make any changes if needed Continue to work on healthy diet and regular exercise- I'm SO proud of you! Call with any questions or concerns Happy Fall!!!

## 2018-10-22 NOTE — Assessment & Plan Note (Signed)
Chronic problem.  Currently well controlled.  No med changes at this time.  Will follow.

## 2018-10-22 NOTE — Assessment & Plan Note (Signed)
Pt has lost 50 lbs since her last visit and 60 overall!  Applauded her efforts and will continue to follow.  Check labs to risk stratify.  Will follow.

## 2018-10-23 ENCOUNTER — Other Ambulatory Visit: Payer: Self-pay | Admitting: Family Medicine

## 2018-10-23 DIAGNOSIS — R7989 Other specified abnormal findings of blood chemistry: Secondary | ICD-10-CM

## 2018-10-23 DIAGNOSIS — R945 Abnormal results of liver function studies: Secondary | ICD-10-CM

## 2018-11-01 ENCOUNTER — Other Ambulatory Visit: Payer: Self-pay | Admitting: Family Medicine

## 2018-12-02 ENCOUNTER — Other Ambulatory Visit: Payer: Self-pay | Admitting: Family Medicine

## 2018-12-31 ENCOUNTER — Other Ambulatory Visit: Payer: Self-pay | Admitting: Family Medicine

## 2019-03-18 ENCOUNTER — Other Ambulatory Visit: Payer: Self-pay | Admitting: Emergency Medicine

## 2019-03-18 DIAGNOSIS — E038 Other specified hypothyroidism: Secondary | ICD-10-CM

## 2019-03-18 MED ORDER — LEVOTHYROXINE SODIUM 100 MCG PO TABS: 100.0000 ug | ORAL_TABLET | Freq: Every day | ORAL | 1 refills | Status: DC

## 2019-04-03 ENCOUNTER — Other Ambulatory Visit: Payer: Self-pay | Admitting: Family Medicine

## 2019-05-07 ENCOUNTER — Other Ambulatory Visit: Payer: Self-pay | Admitting: Family Medicine

## 2019-06-09 ENCOUNTER — Other Ambulatory Visit: Payer: Self-pay | Admitting: Family Medicine

## 2019-07-08 ENCOUNTER — Other Ambulatory Visit: Payer: Self-pay | Admitting: Family Medicine

## 2019-07-10 ENCOUNTER — Other Ambulatory Visit: Payer: Self-pay | Admitting: Family Medicine

## 2019-08-11 ENCOUNTER — Other Ambulatory Visit: Payer: Self-pay | Admitting: Family Medicine

## 2019-08-11 NOTE — Telephone Encounter (Signed)
Please advise, pt has not bee seen since 09/2018

## 2019-09-01 ENCOUNTER — Other Ambulatory Visit: Payer: Self-pay | Admitting: Family Medicine

## 2019-09-18 ENCOUNTER — Other Ambulatory Visit: Payer: Self-pay | Admitting: Family Medicine

## 2019-09-25 ENCOUNTER — Encounter: Payer: Self-pay | Admitting: Family Medicine

## 2019-09-25 ENCOUNTER — Other Ambulatory Visit: Payer: Self-pay

## 2019-09-25 ENCOUNTER — Ambulatory Visit (INDEPENDENT_AMBULATORY_CARE_PROVIDER_SITE_OTHER): Payer: 59 | Admitting: Family Medicine

## 2019-09-25 ENCOUNTER — Ambulatory Visit (INDEPENDENT_AMBULATORY_CARE_PROVIDER_SITE_OTHER): Payer: 59

## 2019-09-25 DIAGNOSIS — R5383 Other fatigue: Secondary | ICD-10-CM | POA: Diagnosis not present

## 2019-09-25 DIAGNOSIS — F329 Major depressive disorder, single episode, unspecified: Secondary | ICD-10-CM

## 2019-09-25 DIAGNOSIS — F419 Anxiety disorder, unspecified: Secondary | ICD-10-CM

## 2019-09-25 DIAGNOSIS — Z23 Encounter for immunization: Secondary | ICD-10-CM

## 2019-09-25 DIAGNOSIS — M5416 Radiculopathy, lumbar region: Secondary | ICD-10-CM

## 2019-09-25 LAB — CBC WITH DIFFERENTIAL/PLATELET
Basophils Absolute: 0 10*3/uL (ref 0.0–0.1)
Basophils Relative: 0.6 % (ref 0.0–3.0)
Eosinophils Absolute: 0.1 10*3/uL (ref 0.0–0.7)
Eosinophils Relative: 1.7 % (ref 0.0–5.0)
HCT: 43.2 % (ref 36.0–46.0)
Hemoglobin: 14.5 g/dL (ref 12.0–15.0)
Lymphocytes Relative: 24.5 % (ref 12.0–46.0)
Lymphs Abs: 1.8 10*3/uL (ref 0.7–4.0)
MCHC: 33.6 g/dL (ref 30.0–36.0)
MCV: 91.5 fl (ref 78.0–100.0)
Monocytes Absolute: 0.4 10*3/uL (ref 0.1–1.0)
Monocytes Relative: 5.5 % (ref 3.0–12.0)
Neutro Abs: 4.8 10*3/uL (ref 1.4–7.7)
Neutrophils Relative %: 67.7 % (ref 43.0–77.0)
Platelets: 205 10*3/uL (ref 150.0–400.0)
RBC: 4.72 Mil/uL (ref 3.87–5.11)
RDW: 12.8 % (ref 11.5–15.5)
WBC: 7.2 10*3/uL (ref 4.0–10.5)

## 2019-09-25 LAB — B12 AND FOLATE PANEL
Folate: 20.2 ng/mL (ref >5.9)
Vitamin B-12: 295 pg/mL (ref 211–911)

## 2019-09-25 LAB — BASIC METABOLIC PANEL
BUN: 19 mg/dL (ref 6–23)
CO2: 29 mEq/L (ref 19–32)
Calcium: 9.8 mg/dL (ref 8.4–10.5)
Chloride: 102 mEq/L (ref 96–112)
Creatinine, Ser: 0.76 mg/dL (ref 0.40–1.20)
GFR: 80.88 mL/min (ref >60.00)
Glucose, Bld: 135 mg/dL — ABNORMAL HIGH (ref 70–99)
Potassium: 4.4 mEq/L (ref 3.5–5.1)
Sodium: 142 mEq/L (ref 135–145)

## 2019-09-25 LAB — TSH: TSH: 3.09 u[IU]/mL (ref 0.35–4.50)

## 2019-09-25 LAB — VITAMIN D 25 HYDROXY (VIT D DEFICIENCY, FRACTURES): VITD: 48.12 ng/mL (ref 30.00–100.00)

## 2019-09-25 MED ORDER — BUPROPION HCL ER (XL) 300 MG PO TB24: 300.0000 mg | ORAL_TABLET | Freq: Every day | ORAL | 3 refills | Status: DC

## 2019-09-25 MED ORDER — CYCLOBENZAPRINE HCL 10 MG PO TABS: 10.0000 mg | ORAL_TABLET | Freq: Three times a day (TID) | ORAL | 1 refills | Status: DC | PRN

## 2019-09-25 MED ORDER — HYDROCODONE-ACETAMINOPHEN 5-325 MG PO TABS: 1.0000 | ORAL_TABLET | Freq: Four times a day (QID) | ORAL | 0 refills | Status: DC | PRN

## 2019-09-25 NOTE — Assessment & Plan Note (Signed)
Refills provided on cyclobenzaprine and hydrocodone at pt's request

## 2019-09-25 NOTE — Assessment & Plan Note (Signed)
Deteriorated.  Pt has increased stress w/ job requirements, COVID situation.  She is willing to change medications at this time.  Given her fatigue and recent weight gain will increase Wellbutrin to 300mg  daily.  Pt expressed understanding and is in agreement w/ plan.

## 2019-09-25 NOTE — Progress Notes (Signed)
Virtual Visit via Video   I connected with patient on 09/25/19 at  9:30 AM EDT by a video enabled telemedicine application and verified that I am speaking with the correct person using two identifiers.  Location patient: Home Location provider: Acupuncturist, Office Persons participating in the virtual visit: Patient, Provider, [place] (Jess B)  I discussed the limitations of evaluation and management by telemedicine and the availability of in person appointments. The patient expressed understanding and agreed to proceed.  Subjective:   HPI:   Fatigue- 'I have just felt really really tired' x2 weeks.  'it's just hard to hold your head up'.  Going to bed 10-10:30 and waking 5:30-6.  Reports waking feeling rested 'most nights'.  Pt reports fatigue worsens as day goes on.  Pt reports gaining 25 lbs since working from home.  Has been promoted and has increased job stress and responsibility as of 2 months ago.  No diet changes, no medication changes.  Pt reports increased depression and feeling 'overwhelmed'.  Stress w/ daughter's school situation.  Pt feels she needs to make medication adjustments.    Chronic back pain- pt is due for refill on cyclobenzaprine and hydrocodone  ROS:   See pertinent positives and negatives per HPI.  Patient Active Problem List   Diagnosis Date Noted  . Obesity (BMI 30-39.9) 10/22/2018  . Chronic back pain 10/22/2018  . Hyperglycemia 01/07/2016  . Pain of left lower leg 09/27/2015  . Ureteral calculus 03/04/2014  . Nodule of left lung 01/24/2014  . Tobacco use disorder 01/18/2014  . Pulmonary nodules 01/18/2014  . Migraine with aura 03/11/2013  . Hypothyroid 02/25/2012  . Syncope 02/05/2012  . Anxiety and depression 01/16/2012  . General medical examination 01/16/2012  . Irritable bowel syndrome (IBS) 11/20/2011  . Epistaxis 09/28/2011  . Cold sore 09/28/2011  . SCIATICA, LEFT 09/29/2008  . Left lumbar radiculopathy 09/29/2008  . Insomnia,  unspecified 06/19/2007  . Essential hypertension 01/07/2007    Social History   Tobacco Use  . Smoking status: Current Every Day Smoker    Packs/day: 0.50    Years: 11.00    Pack years: 5.50    Types: Cigarettes  . Smokeless tobacco: Never Used  Substance Use Topics  . Alcohol use: No    Current Outpatient Medications:  .  buPROPion (WELLBUTRIN XL) 150 MG 24 hr tablet, Take 1 tablet by mouth once daily, Disp: 30 tablet, Rfl: 0 .  citalopram (CELEXA) 20 MG tablet, Take 1 tablet by mouth once daily, Disp: 30 tablet, Rfl: 0 .  cyclobenzaprine (FLEXERIL) 10 MG tablet, Take 1 tablet (10 mg total) by mouth 3 (three) times daily as needed for muscle spasms., Disp: 60 tablet, Rfl: 1 .  hydrochlorothiazide (HYDRODIURIL) 12.5 MG tablet, TAKE 1 TABLET BY MOUTH ONCE DAILY, Disp: 30 tablet, Rfl: 2 .  HYDROcodone-acetaminophen (NORCO/VICODIN) 5-325 MG tablet, Take 1 tablet by mouth every 6 (six) hours as needed for moderate pain., Disp: 60 tablet, Rfl: 0 .  levothyroxine (SYNTHROID, LEVOTHROID) 100 MCG tablet, Take 1 tablet (100 mcg total) by mouth daily., Disp: 90 tablet, Rfl: 1  No Known Allergies  Objective:   LMP 02/27/2013  AAOx3, NAD NCAT, EOMI No obvious CN deficits Coloring WNL Pt is able to speak clearly, coherently without shortness of breath or increased work of breathing.  Thought process is linear.  Mood is appropriate.   Assessment and Plan:   Fatigue- new.  Pt reports 'exhaustion' by the end of her work day.  Denies waking up tired (as would be the case w/ OSA).  Admits to increased stress and anxiety w/ work, COVID, school situation, etc.  Would like thyroid checked to be sure that meds don't need adjusted.  Will run labs to r/o metabolic issue or vitamin deficiency.  Pt expressed understanding and is in agreement w/ plan.    Annye Asa, MD 09/25/2019

## 2019-09-25 NOTE — Progress Notes (Signed)
I have discussed the procedure for the virtual visit with the patient who has given consent to proceed with assessment and treatment.   Pt unable to obtain vitals. Has been feeling very tired and fatigued the last 2 weeks. Denies any COVID exposure. Has a history of Sleep Apnea does not use a CPAP refused to be reevaluated because "that was a problem when I was 300lbs". Has not had TSH tested in almost a year.   Davis Gourd, CMA

## 2019-09-28 ENCOUNTER — Other Ambulatory Visit (INDEPENDENT_AMBULATORY_CARE_PROVIDER_SITE_OTHER): Payer: 59

## 2019-09-28 DIAGNOSIS — R7309 Other abnormal glucose: Secondary | ICD-10-CM

## 2019-09-29 LAB — HEMOGLOBIN A1C: Hgb A1c MFr Bld: 6 % (ref 4.6–6.5)

## 2019-10-09 ENCOUNTER — Other Ambulatory Visit: Payer: Self-pay | Admitting: Family Medicine

## 2019-10-28 ENCOUNTER — Encounter: Payer: Self-pay | Admitting: Family Medicine

## 2019-10-28 ENCOUNTER — Ambulatory Visit: Payer: 59 | Admitting: Family Medicine

## 2019-10-28 ENCOUNTER — Other Ambulatory Visit: Payer: Self-pay

## 2019-10-28 ENCOUNTER — Ambulatory Visit (INDEPENDENT_AMBULATORY_CARE_PROVIDER_SITE_OTHER): Payer: 59 | Admitting: Family Medicine

## 2019-10-28 DIAGNOSIS — F419 Anxiety disorder, unspecified: Secondary | ICD-10-CM

## 2019-10-28 DIAGNOSIS — F329 Major depressive disorder, single episode, unspecified: Secondary | ICD-10-CM

## 2019-10-28 MED ORDER — CITALOPRAM HYDROBROMIDE 40 MG PO TABS: 40.0000 mg | ORAL_TABLET | Freq: Every day | ORAL | 3 refills | Status: DC

## 2019-10-28 NOTE — Progress Notes (Signed)
   Virtual Visit via Video   I connected with patient on 10/28/19 at  3:30 PM EST by a video enabled telemedicine application and verified that I am speaking with the correct person using two identifiers.  Location patient: Home Location provider: Acupuncturist, Office Persons participating in the virtual visit: Patient, Provider, [place] (Jess B)  I discussed the limitations of evaluation and management by telemedicine and the availability of in person appointments. The patient expressed understanding and agreed to proceed.  Subjective:   HPI:   Anxiety- pt is currently on Citalopram 20mg  daily and at last visit Wellbutrin was increased to 300mg .  Reports no change in anxiety but some improvement in sleep and appetite.  Denies current depressive sxs.  Daughter is pre-teen and 'i'm having a hard time dealing w/ things'.    ROS:   See pertinent positives and negatives per HPI.  Patient Active Problem List   Diagnosis Date Noted  . Obesity (BMI 30-39.9) 10/22/2018  . Chronic back pain 10/22/2018  . Hyperglycemia 01/07/2016  . Pain of left lower leg 09/27/2015  . Ureteral calculus 03/04/2014  . Nodule of left lung 01/24/2014  . Tobacco use disorder 01/18/2014  . Pulmonary nodules 01/18/2014  . Migraine with aura 03/11/2013  . Hypothyroid 02/25/2012  . Syncope 02/05/2012  . Anxiety and depression 01/16/2012  . General medical examination 01/16/2012  . Irritable bowel syndrome (IBS) 11/20/2011  . Epistaxis 09/28/2011  . Cold sore 09/28/2011  . SCIATICA, LEFT 09/29/2008  . Left lumbar radiculopathy 09/29/2008  . Insomnia, unspecified 06/19/2007  . Essential hypertension 01/07/2007    Social History   Tobacco Use  . Smoking status: Current Every Day Smoker    Packs/day: 0.50    Years: 11.00    Pack years: 5.50    Types: Cigarettes  . Smokeless tobacco: Never Used  Substance Use Topics  . Alcohol use: No    Current Outpatient Medications:  .  buPROPion (WELLBUTRIN  XL) 300 MG 24 hr tablet, Take 1 tablet (300 mg total) by mouth daily., Disp: 30 tablet, Rfl: 3 .  citalopram (CELEXA) 20 MG tablet, Take 1 tablet by mouth once daily, Disp: 90 tablet, Rfl: 0 .  cyclobenzaprine (FLEXERIL) 10 MG tablet, Take 1 tablet (10 mg total) by mouth 3 (three) times daily as needed for muscle spasms., Disp: 60 tablet, Rfl: 1 .  hydrochlorothiazide (HYDRODIURIL) 12.5 MG tablet, TAKE 1 TABLET BY MOUTH ONCE DAILY, Disp: 30 tablet, Rfl: 2 .  HYDROcodone-acetaminophen (NORCO/VICODIN) 5-325 MG tablet, Take 1 tablet by mouth every 6 (six) hours as needed for moderate pain., Disp: 60 tablet, Rfl: 0 .  levothyroxine (SYNTHROID, LEVOTHROID) 100 MCG tablet, Take 1 tablet (100 mcg total) by mouth daily., Disp: 90 tablet, Rfl: 1  No Known Allergies  Objective:   LMP 02/27/2013   AAOx3, NAD NCAT, EOMI No obvious CN deficits Coloring WNL Pt is able to speak clearly, coherently without shortness of breath or increased work of breathing.  Thought process is linear.  Mood is appropriate.   Assessment and Plan:   Anxiety- ongoing issue for pt.  Reports sleep and appetite are better but anxiety continues.  Will increase Citalopram to 40mg  daily and monitor closely.  Will continue to follow.  Pt expressed understanding and is in agreement w/ plan.   Annye Asa, MD 10/28/2019

## 2019-10-28 NOTE — Progress Notes (Signed)
I have discussed the procedure for the virtual visit with the patient who has given consent to proceed with assessment and treatment.   Pt unable to obtain vitals.   Jessica L Brodmerkel, CMA     

## 2019-11-09 ENCOUNTER — Other Ambulatory Visit: Payer: Self-pay | Admitting: Family Medicine

## 2019-11-09 DIAGNOSIS — E038 Other specified hypothyroidism: Secondary | ICD-10-CM

## 2019-11-17 ENCOUNTER — Ambulatory Visit: Payer: 59 | Admitting: Family Medicine

## 2020-01-05 ENCOUNTER — Other Ambulatory Visit: Payer: Self-pay | Admitting: General Practice

## 2020-01-05 MED ORDER — BUPROPION HCL ER (XL) 300 MG PO TB24: 300.0000 mg | ORAL_TABLET | Freq: Every day | ORAL | 3 refills | Status: DC

## 2020-02-08 ENCOUNTER — Other Ambulatory Visit: Payer: Self-pay | Admitting: Family Medicine

## 2020-02-15 ENCOUNTER — Encounter: Payer: Self-pay | Admitting: Family Medicine

## 2020-03-01 ENCOUNTER — Other Ambulatory Visit: Payer: Self-pay | Admitting: Family Medicine

## 2020-03-01 DIAGNOSIS — E038 Other specified hypothyroidism: Secondary | ICD-10-CM

## 2020-03-02 ENCOUNTER — Other Ambulatory Visit: Payer: Self-pay | Admitting: Family Medicine

## 2020-03-02 DIAGNOSIS — Z1211 Encounter for screening for malignant neoplasm of colon: Secondary | ICD-10-CM

## 2020-03-04 ENCOUNTER — Encounter: Payer: Self-pay | Admitting: Gastroenterology

## 2020-03-23 ENCOUNTER — Ambulatory Visit (AMBULATORY_SURGERY_CENTER): Payer: Self-pay | Admitting: *Deleted

## 2020-03-23 ENCOUNTER — Other Ambulatory Visit: Payer: Self-pay

## 2020-03-23 VITALS — Temp 97.3°F | Ht 66.0 in | Wt 284.0 lb

## 2020-03-23 DIAGNOSIS — Z01818 Encounter for other preprocedural examination: Secondary | ICD-10-CM

## 2020-03-23 DIAGNOSIS — Z86010 Personal history of colonic polyps: Secondary | ICD-10-CM

## 2020-03-23 MED ORDER — SUPREP BOWEL PREP KIT 17.5-3.13-1.6 GM/177ML PO SOLN: 1.0000 | Freq: Once | ORAL | 0 refills | Status: AC

## 2020-03-23 NOTE — Progress Notes (Signed)
No egg or soy allergy known to patient  No issues with past sedation with any surgeries  or procedures, no intubation problems - wakes up fighting at times -  No diet pills per patient No home 02 use per patient  No blood thinners per patient  Pt denies issues with constipation  No A fib or A flutter  EMMI video sent to pt's e mail   Suprep code to pharmacy and coupon to pt   Due to the COVID-19 pandemic we are asking patients to follow these guidelines. Please only bring one care partner. Please be aware that your care partner may wait in the car in the parking lot or if they feel like they will be too hot to wait in the car, they may wait in the lobby on the 4th floor. All care partners are required to wear a mask the entire time (we do not have any that we can provide them), they need to practice social distancing, and we will do a Covid check for all patient's and care partners when you arrive. Also we will check their temperature and your temperature. If the care partner waits in their car they need to stay in the parking lot the entire time and we will call them on their cell phone when the patient is ready for discharge so they can bring the car to the front of the building. Also all patient's will need to wear a mask into building.

## 2020-03-25 ENCOUNTER — Other Ambulatory Visit: Payer: Self-pay | Admitting: Family Medicine

## 2020-04-01 ENCOUNTER — Ambulatory Visit (INDEPENDENT_AMBULATORY_CARE_PROVIDER_SITE_OTHER): Payer: No Typology Code available for payment source

## 2020-04-01 ENCOUNTER — Other Ambulatory Visit: Payer: Self-pay | Admitting: Gastroenterology

## 2020-04-01 DIAGNOSIS — Z1159 Encounter for screening for other viral diseases: Secondary | ICD-10-CM

## 2020-04-02 LAB — SARS CORONAVIRUS 2 (TAT 6-24 HRS): SARS Coronavirus 2: NEGATIVE

## 2020-04-05 ENCOUNTER — Encounter: Payer: Self-pay | Admitting: Gastroenterology

## 2020-04-06 ENCOUNTER — Other Ambulatory Visit: Payer: Self-pay

## 2020-04-06 ENCOUNTER — Encounter: Payer: Self-pay | Admitting: Gastroenterology

## 2020-04-06 ENCOUNTER — Ambulatory Visit (AMBULATORY_SURGERY_CENTER): Payer: No Typology Code available for payment source | Admitting: Gastroenterology

## 2020-04-06 VITALS — BP 113/56 | HR 69 | Temp 97.8°F | Resp 15 | Ht 66.0 in | Wt 284.0 lb

## 2020-04-06 DIAGNOSIS — Z86010 Personal history of colonic polyps: Secondary | ICD-10-CM

## 2020-04-06 MED ORDER — SODIUM CHLORIDE 0.9 % IV SOLN: 500.0000 mL | INTRAVENOUS | Status: DC

## 2020-04-06 NOTE — Progress Notes (Signed)
PT taken to PACU. Monitors in place. VSS. Report given to RN. 

## 2020-04-06 NOTE — Patient Instructions (Signed)
Handouts given for diverticulosis and high fiber diet.  Repeat colonoscopy in 3 years with more extensive prep.  YOU HAD AN ENDOSCOPIC PROCEDURE TODAY AT [facility]:   Refer to the procedure report that was given to you for any specific questions about what was found during the examination.  If the procedure report does not answer your questions, please call your gastroenterologist to clarify.  If you requested that your care partner not be given the details of your procedure findings, then the procedure report has been included in a sealed envelope for you to review at your convenience later.  YOU SHOULD EXPECT: Some feelings of bloating in the abdomen. Passage of more gas than usual.  Walking can help get rid of the air that was put into your GI tract during the procedure and reduce the bloating. If you had a lower endoscopy (such as a colonoscopy or flexible sigmoidoscopy) you may notice spotting of blood in your stool or on the toilet paper. If you underwent a bowel prep for your procedure, you may not have a normal bowel movement for a few days.  Please Note:  You might notice some irritation and congestion in your nose or some drainage.  This is from the oxygen used during your procedure.  There is no need for concern and it should clear up in a day or so.  SYMPTOMS TO REPORT IMMEDIATELY:   Following lower endoscopy (colonoscopy or flexible sigmoidoscopy):  Excessive amounts of blood in the stool  Significant tenderness or worsening of abdominal pains  Swelling of the abdomen that is new, acute  Fever of 100F or higher  For urgent or emergent issues, a gastroenterologist can be reached at any hour by calling [phone]. Do not use MyChart messaging for urgent concerns.    DIET:  We do recommend a small meal at first, but then you may proceed to your regular diet.  Drink plenty of fluids but you should avoid alcoholic beverages for 24 hours.  ACTIVITY:  You should  plan to take it easy for the rest of today and you should NOT DRIVE or use heavy machinery until tomorrow (because of the sedation medicines used during the test).    FOLLOW UP: Our staff will call the number listed on your records 48-72 hours following your procedure to check on you and address any questions or concerns that you may have regarding the information given to you following your procedure. If we do not reach you, we will leave a message.  We will attempt to reach you two times.  During this call, we will ask if you have developed any symptoms of COVID 19. If you develop any symptoms (ie: fever, flu-like symptoms, shortness of breath, cough etc.) before then, please call [phone].  If you test positive for Covid 19 in the 2 weeks post procedure, please call and report this information to [place].    If any biopsies were taken you will be contacted by phone or by letter within the next 1-3 weeks.  Please call us at [phone] if you have not heard about the biopsies in 3 weeks.    SIGNATURES/CONFIDENTIALITY: You and/or your care partner have signed paperwork which will be entered into your electronic medical record.  These signatures attest to the fact that that the information above on your After Visit Summary has been reviewed and is understood.  Full responsibility of the confidentiality of this discharge information lies with you and/or your care-partner.

## 2020-04-06 NOTE — Progress Notes (Signed)
DT- vs in adm  LC- temp-front desk  Pt's states no medical or surgical changes since previsit or office visit.

## 2020-04-06 NOTE — Op Note (Signed)
[facility] Patient Name: Monique Gross Procedure Date: 04/06/2020 2:29 PM MRN: [id number] Endoscopist: Ladene Artist , MD Age: 49 Referring MD:  Date of Birth: 1971/03/15 Gender: Female Account #: [id number] Procedure:                Colonoscopy Indications:              Surveillance: Personal history of adenomatous                            polyps on last colonoscopy > 5 years ago Medicines:                Monitored Anesthesia Care Procedure:                Pre-Anesthesia Assessment:                           - Prior to the procedure, a History and Physical                            was performed, and patient medications and                            allergies were reviewed. The patient's tolerance of                            previous anesthesia was also reviewed. The risks                            and benefits of the procedure and the sedation                            options and risks were discussed with the patient.                            All questions were answered, and informed consent                            was obtained. Prior Anticoagulants: The patient has                            taken no previous anticoagulant or antiplatelet                            agents. ASA Grade Assessment: II - A patient with                            mild systemic disease. After reviewing the risks                            and benefits, the patient was deemed in                            satisfactory condition to undergo the procedure.  After obtaining informed consent, the colonoscope                            was passed under direct vision. Throughout the                            procedure, the patient's blood pressure, pulse, and                            oxygen saturations were monitored continuously. The                            Colonoscope was introduced through the anus and                            advanced to the the  cecum, identified by                            appendiceal orifice and ileocecal valve. The                            ileocecal valve, appendiceal orifice, and rectum                            were photographed. The quality of the bowel                            preparation was adequate after extensive lavage and                            suction. The patient tolerated the procedure well.                            The colonoscopy was somewhat difficult due to                            inadequate bowel prep with improved with lavage and                            suction, significant looping and a tortuous colon. Scope In: 2:44:22 PM Scope Out: 3:09:08 PM Scope Withdrawal Time: 0 hours 18 minutes 50 seconds  Total Procedure Duration: 0 hours 24 minutes 46 seconds  Findings:                 The perianal and digital rectal examinations were                            normal.                           Multiple medium-mouthed diverticula were found in                            the left colon. There was evidence of diverticular  spasm. There was no evidence of diverticular                            bleeding.                           The exam was otherwise without abnormality on                            direct and retroflexion views. Complications:            No immediate complications. Estimated blood loss:                            None. Estimated Blood Loss:     Estimated blood loss: none. Impression:               - Moderate diverticulosis in the left colon.                           - The examination was otherwise normal on direct                            and retroflexion views.                           - No specimens collected. Recommendation:           - Repeat colonoscopy in 3 years for surveillance                            with a more extensive bowel prep.                           - Patient has a contact number available for                             emergencies. The signs and symptoms of potential                            delayed complications were discussed with the                            patient. Return to normal activities tomorrow.                            Written discharge instructions were provided to the                            patient.                           - High fiber diet.                           - Continue present medications. Ladene Artist, MD 04/06/2020 3:13:55 PM This report has been signed electronically.

## 2020-04-08 ENCOUNTER — Telehealth: Payer: Self-pay | Admitting: *Deleted

## 2020-04-08 NOTE — Telephone Encounter (Signed)
  Follow up Call-  Call back number 04/06/2020  Post procedure Call Back phone  # [phone]  Permission to leave phone message Yes  Some recent data might be hidden     Patient questions:  Do you have a fever, pain , or abdominal swelling? No. Pain Score  0 *  Have you tolerated food without any problems? Yes.    Have you been able to return to your normal activities? Yes.    Do you have any questions about your discharge instructions: Diet   No. Medications  No. Follow up visit  No.  Do you have questions or concerns about your Care? No.  Actions: * If pain score is 4 or above: No action needed, pain <4.   1. Have you developed a fever since your procedure? no  2.   Have you had an respiratory symptoms (SOB or cough) since your procedure? no  3.   Have you tested positive for COVID 19 since your procedure no  4.   Have you had any family members/close contacts diagnosed with the COVID 19 since your procedure?  no   If yes to any of these questions please route to Joylene John, RN and Erenest Rasher, RN

## 2020-04-27 ENCOUNTER — Other Ambulatory Visit: Payer: Self-pay | Admitting: Family Medicine

## 2020-05-25 ENCOUNTER — Other Ambulatory Visit (HOSPITAL_COMMUNITY): Payer: Self-pay | Admitting: Pediatric Cardiology

## 2020-05-25 ENCOUNTER — Other Ambulatory Visit (HOSPITAL_COMMUNITY): Payer: Self-pay | Admitting: Neurosurgery

## 2020-05-25 ENCOUNTER — Other Ambulatory Visit: Payer: Self-pay

## 2020-05-25 ENCOUNTER — Ambulatory Visit (HOSPITAL_COMMUNITY)
Admission: RE | Admit: 2020-05-25 | Discharge: 2020-05-25 | Disposition: A | Discharge status: Home / Self Care | Payer: No Typology Code available for payment source | Source: Ambulatory Visit | Attending: Neurosurgery | Admitting: Neurosurgery

## 2020-05-25 DIAGNOSIS — M5416 Radiculopathy, lumbar region: Secondary | ICD-10-CM | POA: Insufficient documentation

## 2020-05-25 DIAGNOSIS — R030 Elevated blood-pressure reading, without diagnosis of hypertension: Secondary | ICD-10-CM | POA: Insufficient documentation

## 2020-05-25 DIAGNOSIS — M5126 Other intervertebral disc displacement, lumbar region: Secondary | ICD-10-CM | POA: Insufficient documentation

## 2020-05-25 DIAGNOSIS — Z6841 Body Mass Index (BMI) 40.0 and over, adult: Secondary | ICD-10-CM | POA: Insufficient documentation

## 2020-05-25 MED ORDER — GADOBUTROL 1 MMOL/ML IV SOLN
10.0000 mL | Freq: Once | INTRAVENOUS | Status: AC | PRN
  Administered 2020-05-25: 10 mL via INTRAVENOUS

## 2020-05-27 ENCOUNTER — Other Ambulatory Visit: Payer: Self-pay | Admitting: Family Medicine

## 2020-06-20 ENCOUNTER — Other Ambulatory Visit: Payer: Self-pay | Admitting: Family Medicine

## 2020-06-20 DIAGNOSIS — E038 Other specified hypothyroidism: Secondary | ICD-10-CM

## 2020-07-14 ENCOUNTER — Other Ambulatory Visit: Payer: Self-pay | Admitting: Family Medicine

## 2020-07-19 ENCOUNTER — Telehealth: Payer: Self-pay | Admitting: Family Medicine

## 2020-07-19 MED ORDER — CITALOPRAM HYDROBROMIDE 40 MG PO TABS: 40.0000 mg | ORAL_TABLET | Freq: Every day | ORAL | 6 refills | Status: DC

## 2020-07-19 MED ORDER — BUPROPION HCL ER (XL) 300 MG PO TB24: 300.0000 mg | ORAL_TABLET | Freq: Every day | ORAL | 6 refills | Status: DC

## 2020-07-19 NOTE — Telephone Encounter (Signed)
Ok to restart meds.  Please let pt know that she doesn't want to let herself run out of these medications as abruptly stopping them will be incredibly difficult

## 2020-07-19 NOTE — Telephone Encounter (Signed)
Pt called in asking for refills on the bupropion and the citalopram, pt states that she has been off these medications for 5days and is having panic attacks and her anxiety is up.   She has a VV scheduled tomorrow with Birdie Riddle since pt is out of the medication she wanted to know if this could be sent in today to the [facility] in [place].  Please advise

## 2020-07-19 NOTE — Telephone Encounter (Signed)
Ok to restart

## 2020-07-19 NOTE — Telephone Encounter (Signed)
Medication filled to pharmacy as requested.  Pt informed.   

## 2020-07-20 ENCOUNTER — Encounter: Payer: Self-pay | Admitting: Family Medicine

## 2020-07-20 ENCOUNTER — Other Ambulatory Visit: Payer: Self-pay

## 2020-07-20 ENCOUNTER — Telehealth (INDEPENDENT_AMBULATORY_CARE_PROVIDER_SITE_OTHER): Payer: No Typology Code available for payment source | Admitting: Family Medicine

## 2020-07-20 DIAGNOSIS — F419 Anxiety disorder, unspecified: Secondary | ICD-10-CM | POA: Diagnosis not present

## 2020-07-20 DIAGNOSIS — N951 Menopausal and female climacteric states: Secondary | ICD-10-CM

## 2020-07-20 DIAGNOSIS — F329 Major depressive disorder, single episode, unspecified: Secondary | ICD-10-CM

## 2020-07-20 NOTE — Progress Notes (Addendum)
   Subjective:    Patient ID: Monique Gross, female    DOB: 09-15-1971, 49 y.o.   MRN: [id number]  HPI  I connected with patient on  07/20/20 at 11:00 AM EDT by a video enabled telemedicine application and verified that I am speaking with the correct person using two identifiers.  Location patient: Home Location provider: Acupuncturist, Office Persons participating in the virtual visit: Patient, Provider, Walker (Jess B)  I discussed the limitations of evaluation and management by telemedicine and the availability of in person appointments. The patient expressed understanding and agreed to proceed.  Anxiety/Depression- pt ran out of Buproprion 300mg  daily and Citalopram 40mg  daily on Saturday.  Meds are available today for pick up.  Pt reports mood was good prior to running out of medication.  Pt thinks she has entered menopause- increased irritability, hot flashes.  Menopausal sxs- pt is having increased irritability, hot flashes.  Started Black Cohosh to help w/ sxs.  Is going to be restarting SSRI.     Review of Systems For ROS see HPI     Objective:   Physical Exam AAOx3, NAD NCAT, EOMI No obvious CN deficits Coloring WNL Pt is able to speak clearly, coherently without shortness of breath or increased work of breathing.  Thought process is linear.  Mood is appropriate.        Assessment & Plan:  Anxiety/Depression- pt reports sxs are adequately controlled when taking her medication.  She ran out of meds on Saturday and has had some withdrawal sxs since then but her prescriptions are available for her to pick up today at the pharmacy.  She is not interested in making changes at this time.  Menopausal sxs- new.  Pt is now having increased irritability, hot flashes.  This will improve somewhat when she restarts her SSRI and I encouraged her to continue her OTC Black Cohosh supplement.  Will follow.

## 2020-07-20 NOTE — Progress Notes (Signed)
I have discussed the procedure for the virtual visit with the patient who has given consent to proceed with assessment and treatment.   Pt unable to obtain vitals,   Monique Gross, CMA

## 2020-09-02 ENCOUNTER — Encounter: Payer: Self-pay | Admitting: Family Medicine

## 2020-09-05 ENCOUNTER — Encounter: Payer: Self-pay | Admitting: Family Medicine

## 2020-09-06 ENCOUNTER — Other Ambulatory Visit (HOSPITAL_COMMUNITY): Payer: Self-pay | Admitting: Oncology

## 2020-09-06 ENCOUNTER — Telehealth (HOSPITAL_COMMUNITY): Payer: Self-pay | Admitting: Oncology

## 2020-09-06 ENCOUNTER — Encounter: Payer: Self-pay | Admitting: Oncology

## 2020-09-06 DIAGNOSIS — U071 COVID-19: Secondary | ICD-10-CM

## 2020-09-06 NOTE — Telephone Encounter (Signed)
I connected by phone with  Mrs. Szeto to discuss the potential use of an new treatment for mild to moderate COVID-19 viral infection in non-hospitalized patients.   This patient is a age/sex that meets the FDA criteria for Emergency Use Authorization of casirivimab\imdevimab.  Has a (+) direct SARS-CoV-2 viral test result 1. Has mild or moderate COVID-19  2. Is ? 50 years of age and weighs ? 40 kg 3. Is NOT hospitalized due to COVID-19 4. Is NOT requiring oxygen therapy or requiring an increase in baseline oxygen flow rate due to COVID-19 5. Is within 10 days of symptom onset 6. Has at least one of the high risk factor(s) for progression to severe COVID-19 and/or hospitalization as defined in EUA. Specific high risk criteria : Past Medical History:  Diagnosis Date  . Anxiety   . Arthritis    back   . Chronic kidney disease    hx of kidney stones  . Depression   . Diverticulosis   . Diverticulosis   . HTN (hypertension)   . IBS (irritable bowel syndrome)   . Lumbar disc disease   . Migraines   . OSA (obstructive sleep apnea)   . Sleep apnea    no cpap   . Tubular adenoma of colon 12/2011  . Urolithiasis   ?  ?    Symptom onset 09/02/2020   I have spoken and communicated the following to the patient or parent/caregiver:   1. FDA has authorized the emergency use of casirivimab\imdevimab for the treatment of mild to moderate COVID-19 in adults and pediatric patients with positive results of direct SARS-CoV-2 viral testing who are 53 years of age and older weighing at least 40 kg, and who are at high risk for progressing to severe COVID-19 and/or hospitalization.   2. The significant known and potential risks and benefits of casirivimab\imdevimab, and the extent to which such potential risks and benefits are unknown.   3. Information on available alternative treatments and the risks and benefits of those alternatives, including clinical trials.   4. Patients treated with  casirivimab\imdevimab should continue to self-isolate and use infection control measures (e.g., wear mask, isolate, social distance, avoid sharing personal items, clean and disinfect "high touch" surfaces, and frequent handwashing) according to CDC guidelines.    5. The patient or parent/caregiver has the option to accept or refuse casirivimab\imdevimab .   After reviewing this information with the patient, The patient agreed to proceed with receiving casirivimab\imdevimab infusion and will be provided a copy of the Fact sheet prior to receiving the infusion.Rulon Abide, AGNP-C [phone] ([place])

## 2020-09-07 ENCOUNTER — Ambulatory Visit (HOSPITAL_COMMUNITY)
Admission: RE | Admit: 2020-09-07 | Discharge: 2020-09-07 | Disposition: A | Discharge status: Home / Self Care | Payer: No Typology Code available for payment source | Source: Ambulatory Visit | Attending: Pulmonary Disease | Admitting: Pulmonary Disease

## 2020-09-07 DIAGNOSIS — U071 COVID-19: Secondary | ICD-10-CM | POA: Insufficient documentation

## 2020-09-07 MED ORDER — SODIUM CHLORIDE 0.9 % IV SOLN: INTRAVENOUS | Status: DC | PRN

## 2020-09-07 MED ORDER — METHYLPREDNISOLONE SODIUM SUCC 125 MG IJ SOLR: 125.0000 mg | Freq: Once | INTRAMUSCULAR | Status: DC | PRN

## 2020-09-07 MED ORDER — SODIUM CHLORIDE 0.9 % IV SOLN
1200.0000 mg | Freq: Once | INTRAVENOUS | Status: AC
  Administered 2020-09-07: 1200 mg via INTRAVENOUS
  Filled 2020-09-07: qty 10

## 2020-09-07 MED ORDER — FAMOTIDINE IN NACL 20-0.9 MG/50ML-% IV SOLN: 20.0000 mg | Freq: Once | INTRAVENOUS | Status: DC | PRN

## 2020-09-07 MED ORDER — EPINEPHRINE 0.3 MG/0.3ML IJ SOAJ: 0.3000 mg | Freq: Once | INTRAMUSCULAR | Status: DC | PRN

## 2020-09-07 MED ORDER — ALBUTEROL SULFATE HFA 108 (90 BASE) MCG/ACT IN AERS: 2.0000 | INHALATION_SPRAY | Freq: Once | RESPIRATORY_TRACT | Status: DC | PRN

## 2020-09-07 MED ORDER — DIPHENHYDRAMINE HCL 50 MG/ML IJ SOLN: 50.0000 mg | Freq: Once | INTRAMUSCULAR | Status: DC | PRN

## 2020-09-07 NOTE — Discharge Instructions (Signed)

## 2020-09-07 NOTE — Progress Notes (Signed)
  Diagnosis: COVID-19  Physician: Dr. Wright  Procedure: [facility] Med: casirivimab\imdevimab infusion - Provided patient with casirivimab\imdevimab fact sheet for patients, parents and caregivers prior to infusion.  Complications: No immediate complications noted.  Discharge: Discharged home   Malcolm Quast M Lauriel Helin 09/07/2020  

## 2020-10-03 ENCOUNTER — Encounter: Payer: Self-pay | Admitting: Family Medicine

## 2020-10-03 ENCOUNTER — Other Ambulatory Visit: Payer: Self-pay

## 2020-10-03 ENCOUNTER — Ambulatory Visit (INDEPENDENT_AMBULATORY_CARE_PROVIDER_SITE_OTHER): Payer: No Typology Code available for payment source | Admitting: Family Medicine

## 2020-10-03 VITALS — BP 123/73 | HR 76 | Temp 97.6°F | Resp 16 | Ht 66.0 in | Wt 279.4 lb

## 2020-10-03 DIAGNOSIS — Z8616 Personal history of COVID-19: Secondary | ICD-10-CM | POA: Diagnosis not present

## 2020-10-03 DIAGNOSIS — Z23 Encounter for immunization: Secondary | ICD-10-CM | POA: Diagnosis not present

## 2020-10-03 DIAGNOSIS — Z0000 Encounter for general adult medical examination without abnormal findings: Secondary | ICD-10-CM

## 2020-10-03 LAB — CBC WITH DIFFERENTIAL/PLATELET
Basophils Absolute: 0 10*3/uL (ref 0.0–0.1)
Basophils Relative: 0.4 % (ref 0.0–3.0)
Eosinophils Absolute: 0.1 10*3/uL (ref 0.0–0.7)
Eosinophils Relative: 2.1 % (ref 0.0–5.0)
HCT: 41.2 % (ref 36.0–46.0)
Hemoglobin: 14 g/dL (ref 12.0–15.0)
Lymphocytes Relative: 22.6 % (ref 12.0–46.0)
Lymphs Abs: 1.5 10*3/uL (ref 0.7–4.0)
MCHC: 33.9 g/dL (ref 30.0–36.0)
MCV: 89.6 fl (ref 78.0–100.0)
Monocytes Absolute: 0.4 10*3/uL (ref 0.1–1.0)
Monocytes Relative: 5.9 % (ref 3.0–12.0)
Neutro Abs: 4.6 10*3/uL (ref 1.4–7.7)
Neutrophils Relative %: 69 % (ref 43.0–77.0)
Platelets: 195 10*3/uL (ref 150.0–400.0)
RBC: 4.6 Mil/uL (ref 3.87–5.11)
RDW: 13.2 % (ref 11.5–15.5)
WBC: 6.7 10*3/uL (ref 4.0–10.5)

## 2020-10-03 LAB — BASIC METABOLIC PANEL
BUN: 21 mg/dL (ref 6–23)
CO2: 29 mEq/L (ref 19–32)
Calcium: 9.7 mg/dL (ref 8.4–10.5)
Chloride: 103 mEq/L (ref 96–112)
Creatinine, Ser: 0.75 mg/dL (ref 0.40–1.20)
GFR: 92.92 mL/min (ref >60.00)
Glucose, Bld: 102 mg/dL — ABNORMAL HIGH (ref 70–99)
Potassium: 4.1 mEq/L (ref 3.5–5.1)
Sodium: 140 mEq/L (ref 135–145)

## 2020-10-03 LAB — HEPATIC FUNCTION PANEL
ALT: 18 U/L (ref 0–35)
AST: 15 U/L (ref 0–37)
Albumin: 4.4 g/dL (ref 3.5–5.2)
Alkaline Phosphatase: 67 U/L (ref 39–117)
Bilirubin, Direct: 0.1 mg/dL (ref 0.0–0.3)
Total Bilirubin: 0.4 mg/dL (ref 0.2–1.2)
Total Protein: 7 g/dL (ref 6.0–8.3)

## 2020-10-03 LAB — TSH: TSH: 5.05 u[IU]/mL — ABNORMAL HIGH (ref 0.35–4.50)

## 2020-10-03 LAB — LIPID PANEL
Cholesterol: 189 mg/dL (ref 0–200)
HDL: 65 mg/dL (ref >39.00)
LDL Cholesterol: 92 mg/dL (ref 0–99)
NonHDL: 123.91
Total CHOL/HDL Ratio: 3
Triglycerides: 158 mg/dL — ABNORMAL HIGH (ref 0.0–149.0)
VLDL: 31.6 mg/dL (ref 0.0–40.0)

## 2020-10-03 NOTE — Patient Instructions (Addendum)
Follow up in 6 months to recheck BP We'll notify you of your lab results and make any changes if needed Continue to allow yourself time to recover- don't rush it! As you get back to normal activity, try and increase your stamina w/ walking CALL AND SCHEDULE YOUR MAMMOGRAM!!! Call with any questions or concerns Hang in there!!

## 2020-10-03 NOTE — Assessment & Plan Note (Signed)
Pt's PE WNL w/ exception of obesity.  UTD on colonoscopy, Tdap.  Flu shot given today.  Will get 2nd COVID shot 90 days s/p antibody infusion.  Well overdue for a mammogram.  Pt to call and schedule w/ GYN.  Check labs.  Anticipatory guidance provided.

## 2020-10-03 NOTE — Progress Notes (Signed)
   Subjective:    Patient ID: Monique Gross, female    DOB: [birth year], 49 y.o.   MRN: [id number]  HPI CPE- no need for pap due to hysterectomy.  Well overdue for mammo- will call [facility] (last 2014).  UTD on colonoscopy.  UTD on Tdap.  Due for flu shot.  Will hold on 2nd COVID vaccine until 90 days post antibody infusion.  Reviewed past medical, surgical, family and social histories.   Health Maintenance  Topic Date Due  . MAMMOGRAM  09/26/2014  . INFLUENZA VACCINE  07/24/2020  . COVID-19 Vaccine (2 - Pfizer 2-dose series) 10/19/2020 (Originally 09/05/2020)  . HIV Screening  10/27/2020 (Originally 09/20/1985)  . Hepatitis C Screening  07/20/2021 (Originally 1971-08-21)  . COLONOSCOPY  04/07/2023  . TETANUS/TDAP  11/20/2025    Patient Care Team    Relationship Specialty Notifications Start End  Midge Minium, MD PCP - General Family Medicine  09/28/11   Maisie Fus, MD Consulting Physician Obstetrics and Gynecology  09/27/15       Review of Systems Patient reports no vision/ hearing changes, adenopathy,fever, weight change,  persistant/recurrent hoarseness , swallowing issues, chest pain, palpitations, edema, persistant/recurrent cough, hemoptysis, dyspnea (rest/exertional/paroxysmal nocturnal), gastrointestinal bleeding (melena, rectal bleeding), abdominal pain, significant heartburn, bowel changes, GU symptoms (dysuria, hematuria, incontinence), Gyn symptoms (abnormal  bleeding, pain),  syncope, focal weakness, memory loss, numbness & tingling, skin/hair/nail changes, abnormal bruising or bleeding, anxiety, or depression.   This visit occurred during the SARS-CoV-2 public health emergency.  Safety protocols were in place, including screening questions prior to the visit, additional usage of staff PPE, and extensive cleaning of exam room while observing appropriate contact time as indicated for disinfecting solutions.       Objective:   Physical Exam General  Appearance:    Alert, cooperative, no distress, appears stated age  Head:    Normocephalic, without obvious abnormality, atraumatic  Eyes:    PERRL, conjunctiva/corneas clear, EOM's intact, fundi    benign, both eyes  Ears:    Normal TM's and external ear canals, both ears  Nose:   Deferred due to COVID  Throat:   Neck:   Supple, symmetrical, trachea midline, no adenopathy;    Thyroid: no enlargement/tenderness/nodules  Back:     Symmetric, no curvature, ROM normal, no CVA tenderness  Lungs:     Clear to auscultation bilaterally, respirations unlabored  Chest Wall:    No tenderness or deformity   Heart:    Regular rate and rhythm, S1 and S2 normal, no murmur, rub   or gallop  Breast Exam:    Deferred to GYN  Abdomen:     Soft, non-tender, bowel sounds active all four quadrants,    no masses, no organomegaly  Genitalia:    Deferred to GYN  Rectal:    Extremities:   Extremities normal, atraumatic, no cyanosis or edema  Pulses:   2+ and symmetric all extremities  Skin:   Skin color, texture, turgor normal, no rashes or lesions  Lymph nodes:   Cervical, supraclavicular, and axillary nodes normal  Neurologic:   CNII-XII intact, normal strength, sensation and reflexes    throughout          Assessment & Plan:

## 2020-10-03 NOTE — Assessment & Plan Note (Signed)
Continues to recover.  Fatigue and headache are her biggest complaints at this time.  Encouraged her to allow herself time to recover.  Plan is to get 2nd vaccine once 90 days since antibody infusion.

## 2020-10-03 NOTE — Assessment & Plan Note (Signed)
Pt has lost 5 lbs since last visit.  Encouraged her to work on healthy diet and regular physical activity as she recovers from [place].  Check labs to risk stratify.  Will follow.

## 2020-10-04 ENCOUNTER — Other Ambulatory Visit: Payer: Self-pay | Admitting: General Practice

## 2020-10-04 DIAGNOSIS — E038 Other specified hypothyroidism: Secondary | ICD-10-CM

## 2020-10-04 MED ORDER — LEVOTHYROXINE SODIUM 112 MCG PO TABS: 112.0000 ug | ORAL_TABLET | Freq: Every day | ORAL | 3 refills | Status: DC

## 2020-11-12 IMAGING — MR MR LUMBAR SPINE WO/W CM
4 of 7 series · 19 of 48 positions shown · IV contrast (10 ML GAD)
Comparison: [id number]

CLINICAL DATA: Lumbar radiculopathy

EXAM:
MRI LUMBAR SPINE WITHOUT AND WITH CONTRAST
TECHNIQUE: Multiplanar and multiecho pulse sequences of the lumbar spine were
obtained without and with intravenous contrast.
CONTRAST:  10mL GADAVIST GADOBUTROL 1 MMOL/ML IV SOLN

[Series 2: T2 · sagittal · 4.0mm · 0.55mm/px · 5 of 17 slices shown (1 of 2)]
[im 1/17]
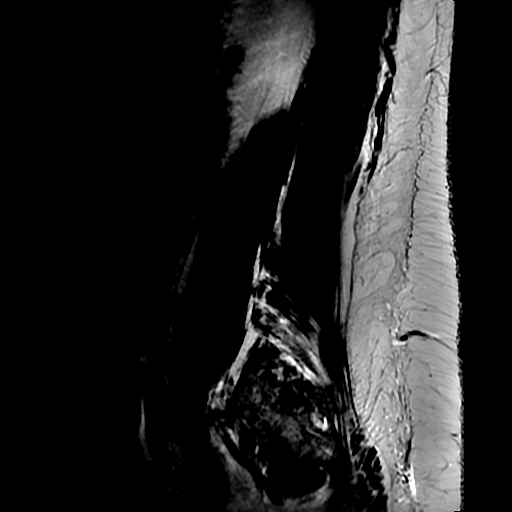
[im 5/17]
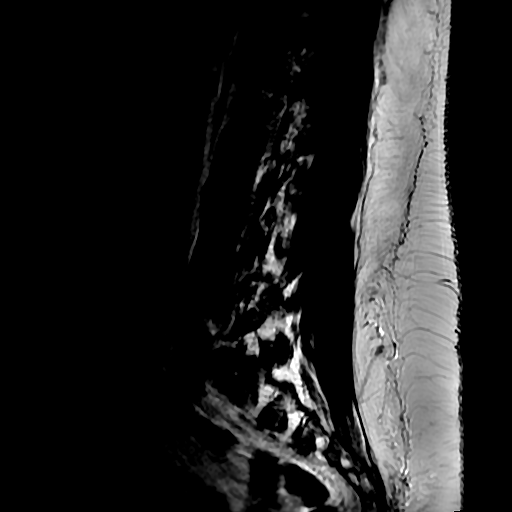
[im 9/17]
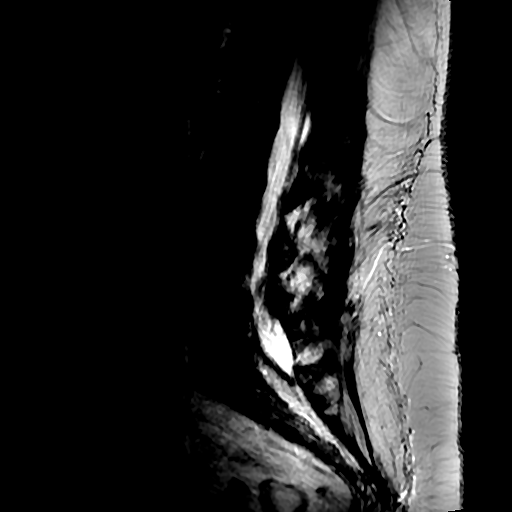
[im 13/17]
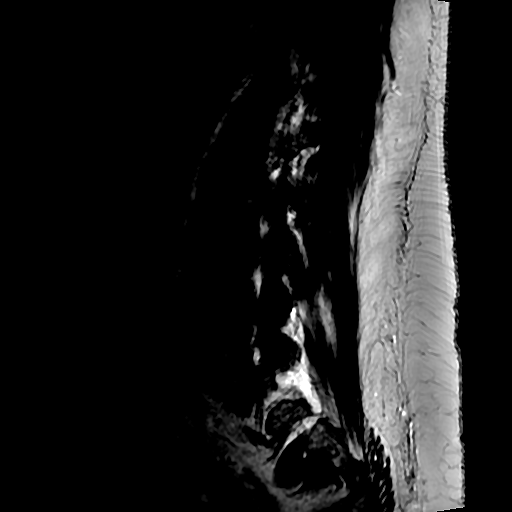
[im 17/17]
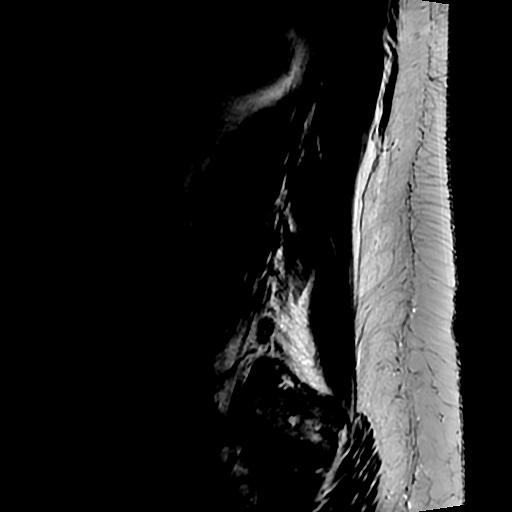

[Series 4: T1 · sagittal · 4.0mm · 0.55mm/px · 3 of 17 slices shown (1 of 2)]
[im 1/17]
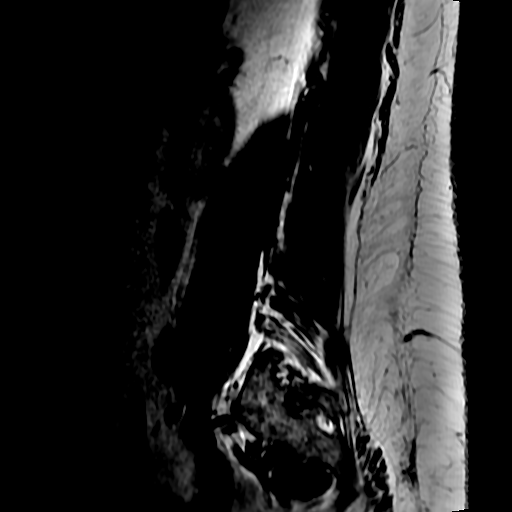
[im 11/17]
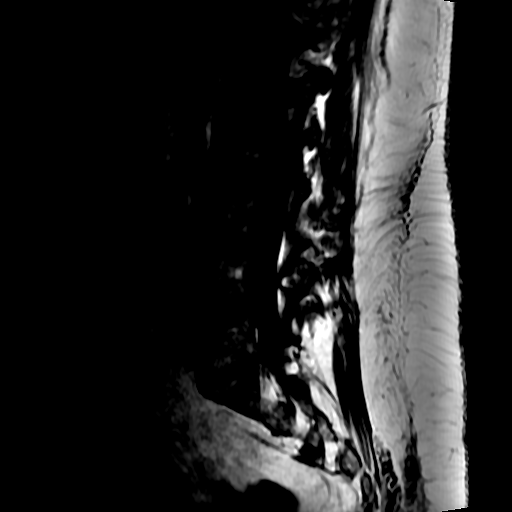
[im 17/17]
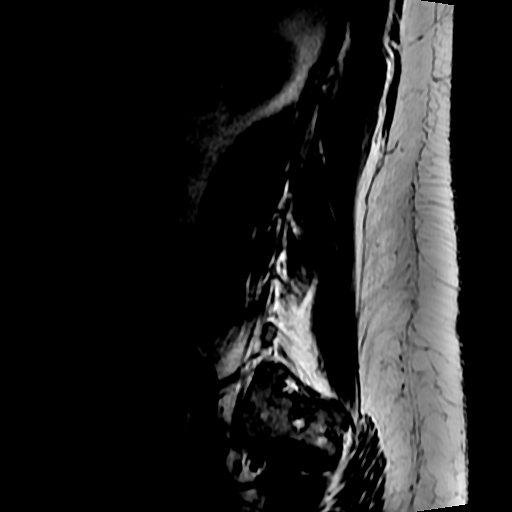

[Series 5: T2 · axial · 4.0mm · 0.39mm/px · z∈[-31,+181]mm · 8 of 40 slices shown (2 of 2)]
[im 1/40]
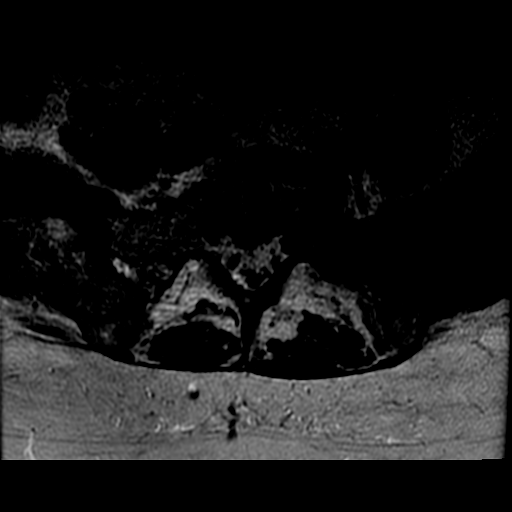
[im 5/40]
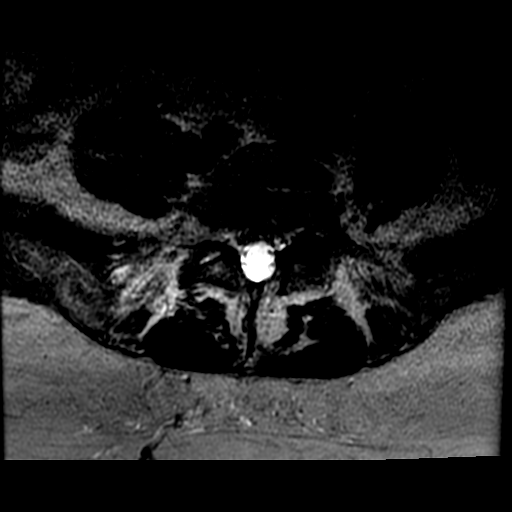
[im 14/40]
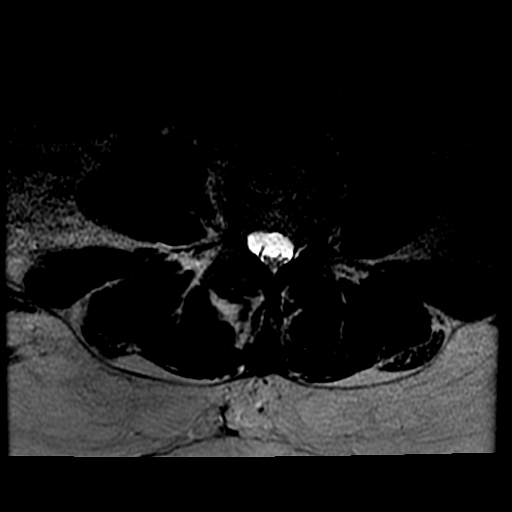
[im 18/40]
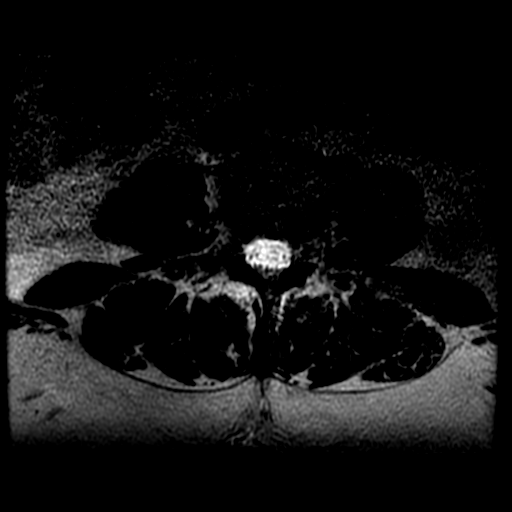
[im 22/40]
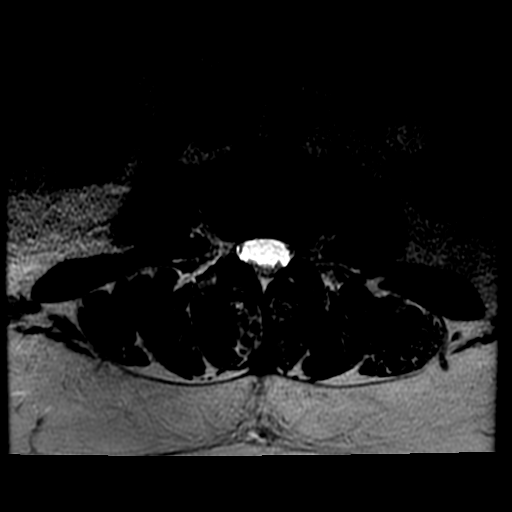
[im 27/40]
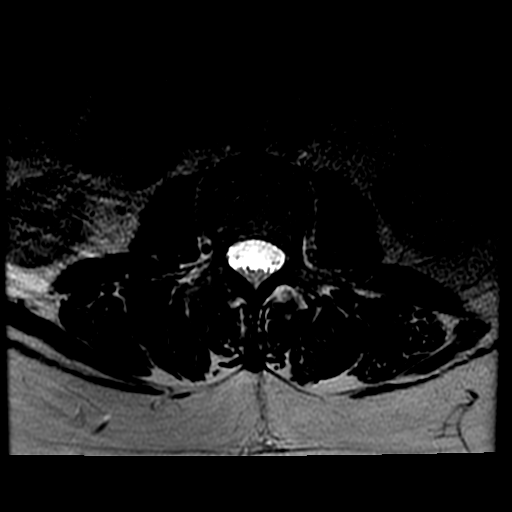
[im 35/40]
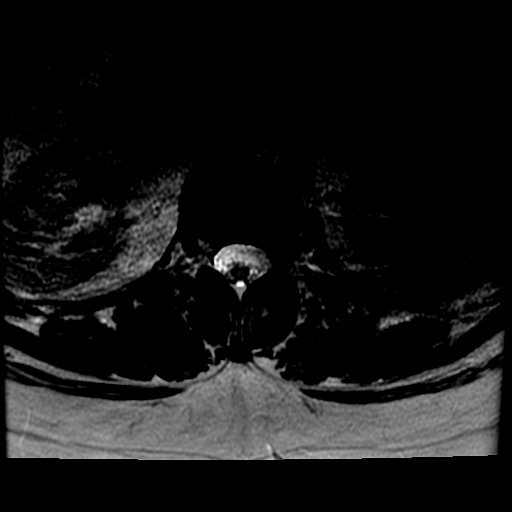
[im 40/40]
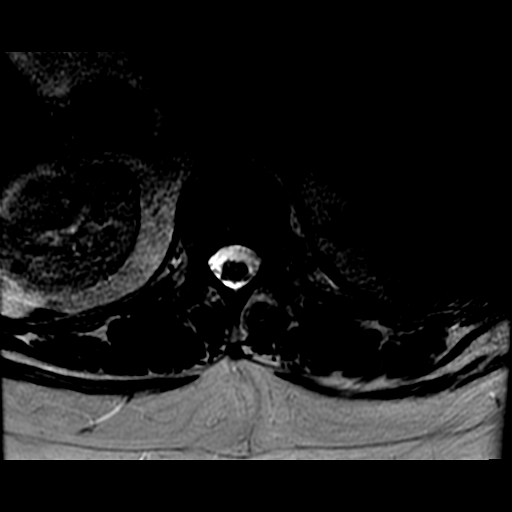

[Series 6: T1 · axial · 4.0mm · 0.39mm/px · z∈[-11,+156]mm · 3 of 39 slices shown (2 of 2)]
[im 5/39]
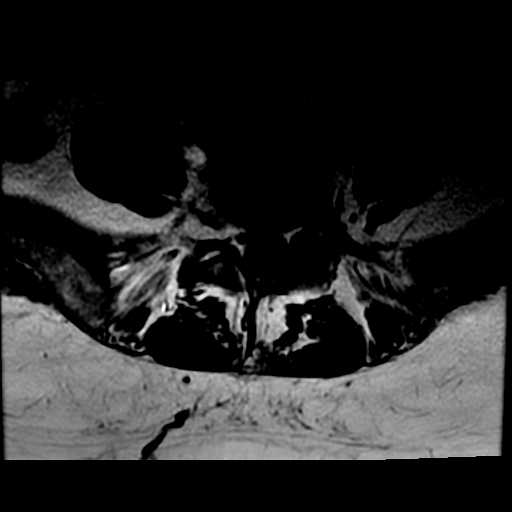
[im 22/39]
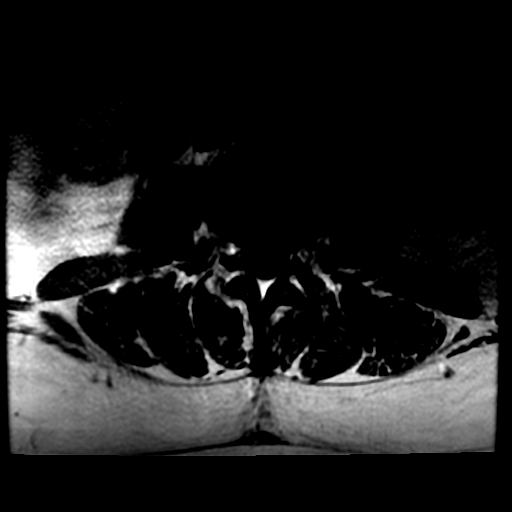
[im 34/39]
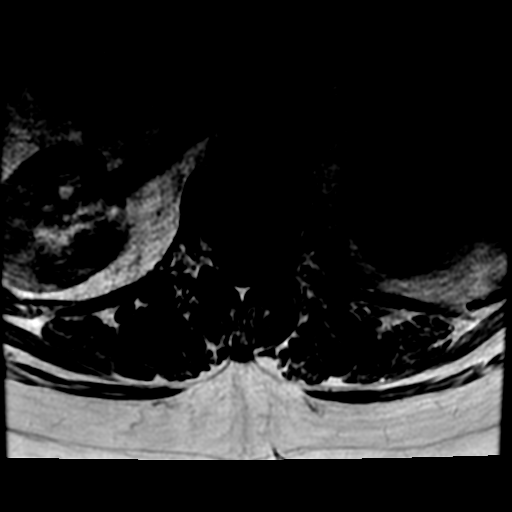

[19 of 48 positions shown; findings below may reference images not displayed]

FINDINGS: Motion artifact is present.

Segmentation:  Standard.

Alignment:  Anteroposterior alignment is maintained.

Vertebrae: Vertebral body heights are preserved. No definite marrow
edema or suspicious osseous lesion.

Conus medullaris and cauda equina: Conus extends to the L1 level.
Conus and cauda equina appear normal.

Paraspinal and other soft tissues: Possible gallstone.

Disc levels:

L1-L2:  No significant stenosis

L2-L3:  No significant stenosis.

L3-L4:  No significant stenosis.

L4-L5: Disc bulge and facet arthropathy. Suspected superimposed left
foraminal protrusion. No significant canal or right foraminal
stenosis. Moderate left foraminal stenosis with disc abutting the
exiting L4 nerve root.

L5-S1: Likely posterior decompression. Disc bulge with endplate
osteophytic ridging and facet arthropathy. No significant canal or
right foraminal stenosis. Mild to moderate left foraminal stenosis.
IMPRESSION: Motion degraded study.

Suspected left foraminal protrusion at L4-L5 abutting the exiting L4
nerve root. Correlate for radiculopathy in this distribution.

## 2020-12-05 ENCOUNTER — Encounter: Payer: Self-pay | Admitting: Family Medicine

## 2020-12-05 MED ORDER — SERTRALINE HCL 100 MG PO TABS: 100.0000 mg | ORAL_TABLET | Freq: Every day | ORAL | 3 refills | Status: DC

## 2021-02-17 ENCOUNTER — Other Ambulatory Visit: Payer: Self-pay | Admitting: Family Medicine

## 2021-02-20 ENCOUNTER — Other Ambulatory Visit: Payer: Self-pay | Admitting: Family Medicine

## 2021-03-22 ENCOUNTER — Other Ambulatory Visit: Payer: Self-pay | Admitting: Family Medicine

## 2021-04-20 ENCOUNTER — Other Ambulatory Visit: Payer: Self-pay | Admitting: Family Medicine

## 2021-05-17 ENCOUNTER — Other Ambulatory Visit: Payer: Self-pay | Admitting: Family Medicine

## 2021-05-19 ENCOUNTER — Other Ambulatory Visit: Payer: Self-pay

## 2021-05-19 ENCOUNTER — Encounter: Payer: Self-pay | Admitting: Family Medicine

## 2021-05-19 MED ORDER — LEVOTHYROXINE SODIUM 112 MCG PO TABS: 112.0000 ug | ORAL_TABLET | Freq: Every day | ORAL | 0 refills | Status: DC

## 2021-05-19 MED ORDER — SERTRALINE HCL 100 MG PO TABS: 1.0000 | ORAL_TABLET | Freq: Every day | ORAL | 0 refills | Status: DC

## 2021-05-19 MED ORDER — BUPROPION HCL ER (XL) 300 MG PO TB24: 1.0000 | ORAL_TABLET | Freq: Every day | ORAL | 0 refills | Status: DC

## 2021-06-21 ENCOUNTER — Encounter: Payer: Self-pay | Admitting: *Deleted

## 2021-08-21 ENCOUNTER — Other Ambulatory Visit: Payer: Self-pay | Admitting: Family Medicine

## 2021-08-21 ENCOUNTER — Encounter: Payer: Self-pay | Admitting: Family Medicine

## 2021-08-21 ENCOUNTER — Other Ambulatory Visit: Payer: Self-pay

## 2021-08-21 MED ORDER — LEVOTHYROXINE SODIUM 112 MCG PO TABS: 112.0000 ug | ORAL_TABLET | Freq: Every day | ORAL | 0 refills | Status: DC

## 2021-08-21 MED ORDER — HYDROCODONE-ACETAMINOPHEN 5-325 MG PO TABS: 1.0000 | ORAL_TABLET | Freq: Four times a day (QID) | ORAL | 0 refills | Status: DC | PRN

## 2021-08-21 MED ORDER — BUPROPION HCL ER (XL) 300 MG PO TB24: 300.0000 mg | ORAL_TABLET | Freq: Every day | ORAL | 0 refills | Status: DC

## 2021-08-21 MED ORDER — SERTRALINE HCL 100 MG PO TABS: 100.0000 mg | ORAL_TABLET | Freq: Every day | ORAL | 0 refills | Status: DC

## 2021-08-21 NOTE — Telephone Encounter (Signed)
LFD 09/25/19 #60 with no refills LOV 10/03/20 NOV none

## 2021-08-22 ENCOUNTER — Telehealth: Payer: Self-pay

## 2021-08-22 NOTE — Telephone Encounter (Signed)
FYI, took call from pharmacist that states they can only dispense 5 pills of the hydrocodone per new guidelines since patient has not had prescription filled since back in 2021. Also, she has not had surgery or anything of that nature and her insurance company deems her opioid nieve. They said if she uses those and you feel she needs more, they can give her more.

## 2021-11-01 ENCOUNTER — Telehealth (INDEPENDENT_AMBULATORY_CARE_PROVIDER_SITE_OTHER): Payer: No Typology Code available for payment source | Admitting: Family Medicine

## 2021-11-01 ENCOUNTER — Encounter: Payer: Self-pay | Admitting: Family Medicine

## 2021-11-01 ENCOUNTER — Other Ambulatory Visit: Payer: Self-pay

## 2021-11-01 DIAGNOSIS — F32A Depression, unspecified: Secondary | ICD-10-CM | POA: Diagnosis not present

## 2021-11-01 DIAGNOSIS — L309 Dermatitis, unspecified: Secondary | ICD-10-CM | POA: Insufficient documentation

## 2021-11-01 DIAGNOSIS — I10 Essential (primary) hypertension: Secondary | ICD-10-CM | POA: Diagnosis not present

## 2021-11-01 DIAGNOSIS — I872 Venous insufficiency (chronic) (peripheral): Secondary | ICD-10-CM | POA: Insufficient documentation

## 2021-11-01 DIAGNOSIS — E038 Other specified hypothyroidism: Secondary | ICD-10-CM | POA: Diagnosis not present

## 2021-11-01 DIAGNOSIS — F419 Anxiety disorder, unspecified: Secondary | ICD-10-CM

## 2021-11-01 MED ORDER — SERTRALINE HCL 100 MG PO TABS: 100.0000 mg | ORAL_TABLET | Freq: Every day | ORAL | 0 refills | Status: DC

## 2021-11-01 MED ORDER — HYDROCODONE-ACETAMINOPHEN 5-325 MG PO TABS: 1.0000 | ORAL_TABLET | Freq: Four times a day (QID) | ORAL | 0 refills | Status: DC | PRN

## 2021-11-01 MED ORDER — LEVOTHYROXINE SODIUM 112 MCG PO TABS: 112.0000 ug | ORAL_TABLET | Freq: Every day | ORAL | 0 refills | Status: DC

## 2021-11-01 MED ORDER — BUPROPION HCL ER (XL) 300 MG PO TB24: 300.0000 mg | ORAL_TABLET | Freq: Every day | ORAL | 0 refills | Status: DC

## 2021-11-01 MED ORDER — CYCLOBENZAPRINE HCL 10 MG PO TABS
10.0000 mg | ORAL_TABLET | Freq: Three times a day (TID) | ORAL | 1 refills | Status: DC | PRN
Start: 2021-11-01 — End: 2023-01-18

## 2021-11-01 NOTE — Progress Notes (Signed)
I connected with  Monique Gross on 11/01/21 by a video enabled telemedicine application and verified that I am speaking with the correct person using two identifiers.   I discussed the limitations of evaluation and management by telemedicine. The patient expressed understanding and agreed to proceed.

## 2021-11-01 NOTE — Progress Notes (Signed)
Virtual Visit via Video   I connected with patient on 11/01/21 at 11:30 AM EST by a video enabled telemedicine application and verified that I am speaking with the correct person using two identifiers.  Location patient: Home Location provider: Fernande Gross, Office Persons participating in the virtual visit: Patient, Provider, [place] (Sabrina M)  I discussed the limitations of evaluation and management by telemedicine and the availability of in person appointments. The patient expressed understanding and agreed to proceed.  Subjective:   HPI:   Anxiety/Depression- ongoing issue for pt.  Currently on Wellbutrin 300mg  and Sertraline 100mg  daily.  Daughter is currently struggling w/ her own mental health issues.  Higher dose of Wellbutrin has been helpful.  'i'm not divorced and I still have a job'.    Hypothyroid- chronic problem, on Levothyroxine 155mcg daily.  Pt reports energy level is stable.  + dry skin  HTN- pt has stopped her HCTZ.  Does not have a BP reading for today.  Will check BP is she's at [facility] and typically runs high 130s/90.  No CP, SOB, HAs, visual changes, edema.  ROS:   See pertinent positives and negatives per HPI.  Patient Active Problem List   Diagnosis Date Noted   Dermatitis 11/01/2021   Peripheral venous insufficiency 11/01/2021   History of COVID-19 10/03/2020   Body mass index (BMI) 45.0-49.9, adult ([place]) 05/25/2020   Disc displacement, lumbar 05/25/2020   Elevated blood-pressure reading, without diagnosis of hypertension 05/25/2020   Morbid obesity ([place]) 10/22/2018   Chronic back pain 10/22/2018   Hyperglycemia 01/07/2016   Pain of left lower leg 09/27/2015   Ureteral calculus 03/04/2014   Nodule of left lung 01/24/2014   Tobacco use disorder 01/18/2014   Pulmonary nodules 01/18/2014   Neck pain 07/22/2013   Migraine with aura 03/11/2013   Hypothyroid 02/25/2012   Syncope 02/05/2012   Anxiety and depression 01/16/2012   General  medical examination 01/16/2012   Irritable bowel syndrome (IBS) 11/20/2011   Epistaxis 09/28/2011   Cold sore 09/28/2011   SCIATICA, LEFT 09/29/2008   Left lumbar radiculopathy 09/29/2008   Insomnia, unspecified 06/19/2007   Essential hypertension 01/07/2007    Social History   Tobacco Use   Smoking status: Every Day    Packs/day: 0.50    Years: 11.00    Pack years: 5.50    Types: Cigarettes   Smokeless tobacco: Never  Substance Use Topics   Alcohol use: No    Current Outpatient Medications:    buPROPion (WELLBUTRIN XL) 300 MG 24 hr tablet, Take 1 tablet (300 mg total) by mouth daily., Disp: 90 tablet, Rfl: 0   cyclobenzaprine (FLEXERIL) 10 MG tablet, Take 1 tablet (10 mg total) by mouth 3 (three) times daily as needed for muscle spasms., Disp: 60 tablet, Rfl: 1   HYDROcodone-acetaminophen (NORCO/VICODIN) 5-325 MG tablet, Take 1 tablet by mouth every 6 (six) hours as needed for moderate pain., Disp: 60 tablet, Rfl: 0   levothyroxine (EUTHYROX) 112 MCG tablet, Take 1 tablet (112 mcg total) by mouth daily before breakfast., Disp: 90 tablet, Rfl: 0   sertraline (ZOLOFT) 100 MG tablet, Take 1 tablet (100 mg total) by mouth daily., Disp: 90 tablet, Rfl: 0   hydrochlorothiazide (HYDRODIURIL) 12.5 MG tablet, TAKE 1 TABLET BY MOUTH ONCE DAILY (Patient not taking: No sig reported), Disp: 30 tablet, Rfl: 2  No Known Allergies  Objective:   LMP 02/27/2013  AAOx3, NAD NCAT, EOMI No obvious CN deficits Coloring WNL Pt is able to speak  clearly, coherently without shortness of breath or increased work of breathing.  Thought process is linear.  Mood is appropriate.   Assessment and Plan:   Anxiety/Depression- ongoing issue.  Pt reports the higher dose of Wellbutrin has helped considerably.  No med changes at this time.  Will follow.  Hypothyroid- chronic problem.  Pt feels sxs are unchanged and that the dry skin and the hair changes she is having are due to perimenopause and not  thyroid.  Will have her back for CPE and at that time we will check labs and adjust meds.  HTN- stopped HCTZ.  Feels BP is stable and she does check it at [facility] when she goes.  Currently asymptomatic.  Will have her return after the first of the year to check BP and start meds if needed.   Annye Asa, MD 11/01/2021

## 2021-11-10 ENCOUNTER — Telehealth: Payer: Self-pay

## 2021-11-10 NOTE — Telephone Encounter (Signed)
-----   Message from Midge Minium, MD sent at 11/01/2021 12:06 PM EST ----- Please schedule her for a CPE after the new year.  Thanks!

## 2021-11-10 NOTE — Telephone Encounter (Signed)
Called patient on 11/18 to schedule Phy for next Year

## 2021-11-15 ENCOUNTER — Other Ambulatory Visit: Payer: Self-pay | Admitting: Family Medicine

## 2022-01-08 ENCOUNTER — Encounter: Payer: No Typology Code available for payment source | Admitting: Family Medicine

## 2022-01-17 ENCOUNTER — Ambulatory Visit (INDEPENDENT_AMBULATORY_CARE_PROVIDER_SITE_OTHER): Payer: BC Managed Care – PPO | Admitting: Family Medicine

## 2022-01-17 ENCOUNTER — Encounter: Payer: Self-pay | Admitting: Family Medicine

## 2022-01-17 VITALS — BP 142/84 | HR 77 | Temp 98.2°F | Resp 16 | Ht 66.0 in | Wt 270.4 lb

## 2022-01-17 DIAGNOSIS — Z23 Encounter for immunization: Secondary | ICD-10-CM | POA: Diagnosis not present

## 2022-01-17 DIAGNOSIS — Z114 Encounter for screening for human immunodeficiency virus [HIV]: Secondary | ICD-10-CM | POA: Diagnosis not present

## 2022-01-17 DIAGNOSIS — Z1159 Encounter for screening for other viral diseases: Secondary | ICD-10-CM | POA: Diagnosis not present

## 2022-01-17 DIAGNOSIS — Z0000 Encounter for general adult medical examination without abnormal findings: Secondary | ICD-10-CM

## 2022-01-17 LAB — TSH: TSH: 3.45 u[IU]/mL (ref 0.35–5.50)

## 2022-01-17 LAB — HEMOGLOBIN A1C: Hgb A1c MFr Bld: 6.3 % (ref 4.6–6.5)

## 2022-01-17 LAB — BASIC METABOLIC PANEL
BUN: 20 mg/dL (ref 6–23)
CO2: 27 mEq/L (ref 19–32)
Calcium: 9.5 mg/dL (ref 8.4–10.5)
Chloride: 103 mEq/L (ref 96–112)
Creatinine, Ser: 0.86 mg/dL (ref 0.40–1.20)
GFR: 78.27 mL/min (ref >60.00)
Glucose, Bld: 107 mg/dL — ABNORMAL HIGH (ref 70–99)
Potassium: 4.2 mEq/L (ref 3.5–5.1)
Sodium: 140 mEq/L (ref 135–145)

## 2022-01-17 LAB — LIPID PANEL
Cholesterol: 170 mg/dL (ref 0–200)
HDL: 56.6 mg/dL (ref >39.00)
LDL Cholesterol: 85 mg/dL (ref 0–99)
NonHDL: 113.85
Total CHOL/HDL Ratio: 3
Triglycerides: 146 mg/dL (ref 0.0–149.0)
VLDL: 29.2 mg/dL (ref 0.0–40.0)

## 2022-01-17 LAB — HEPATIC FUNCTION PANEL
ALT: 17 U/L (ref 0–35)
AST: 17 U/L (ref 0–37)
Albumin: 4.5 g/dL (ref 3.5–5.2)
Alkaline Phosphatase: 67 U/L (ref 39–117)
Bilirubin, Direct: 0.1 mg/dL (ref 0.0–0.3)
Total Bilirubin: 0.4 mg/dL (ref 0.2–1.2)
Total Protein: 7.1 g/dL (ref 6.0–8.3)

## 2022-01-17 LAB — CBC WITH DIFFERENTIAL/PLATELET
Basophils Absolute: 0 10*3/uL (ref 0.0–0.1)
Basophils Relative: 0.8 % (ref 0.0–3.0)
Eosinophils Absolute: 0.1 10*3/uL (ref 0.0–0.7)
Eosinophils Relative: 1.6 % (ref 0.0–5.0)
HCT: 43.2 % (ref 36.0–46.0)
Hemoglobin: 14.1 g/dL (ref 12.0–15.0)
Lymphocytes Relative: 22.7 % (ref 12.0–46.0)
Lymphs Abs: 1.3 10*3/uL (ref 0.7–4.0)
MCHC: 32.7 g/dL (ref 30.0–36.0)
MCV: 87.9 fl (ref 78.0–100.0)
Monocytes Absolute: 0.4 10*3/uL (ref 0.1–1.0)
Monocytes Relative: 6.4 % (ref 3.0–12.0)
Neutro Abs: 3.9 10*3/uL (ref 1.4–7.7)
Neutrophils Relative %: 68.5 % (ref 43.0–77.0)
Platelets: 220 10*3/uL (ref 150.0–400.0)
RBC: 4.92 Mil/uL (ref 3.87–5.11)
RDW: 13.7 % (ref 11.5–15.5)
WBC: 5.6 10*3/uL (ref 4.0–10.5)

## 2022-01-17 LAB — VITAMIN D 25 HYDROXY (VIT D DEFICIENCY, FRACTURES): VITD: 40.65 ng/mL (ref 30.00–100.00)

## 2022-01-17 NOTE — Patient Instructions (Signed)
Follow up in 1 year or as needed We'll notify you of your lab results and make any changes if needed Call and schedule your mammo and have them send me a copy Continue to work on healthy diet and regular exercise- you can do it!! Call with any questions or concerns Stay Safe!  Stay Healthy! Hang in there!!!

## 2022-01-17 NOTE — Assessment & Plan Note (Signed)
Pt's PE WNL w/ exception of obesity.  UTD on colonoscopy, Tdap.  Due for mammo- pt to schedule.  1st shingles shot given.  Check labs.  Anticipatory guidance provided.

## 2022-01-17 NOTE — Assessment & Plan Note (Signed)
Pt is down 9 lbs since last visit.  Encouraged healthy diet and regular exercise.  Check labs to risk stratify.  Will follow.

## 2022-01-17 NOTE — Progress Notes (Signed)
° °  Subjective:    Patient ID: Monique Gross, female    DOB: 1971/06/06, 51 y.o.   MRN: [id number]  HPI CPE- UTD on colonoscopy, Tdap.  No need for pap due to hysterectomy.  Due for mammo  Patient Care Team    Relationship Specialty Notifications Start End  Midge Minium, MD PCP - General Family Medicine  09/28/11   Maisie Fus, MD Consulting Physician Obstetrics and Gynecology  09/27/15      Health Maintenance  Topic Date Due   MAMMOGRAM  09/26/2014   Zoster Vaccines- Shingrix (1 of 2) Never done   COVID-19 Vaccine (3 - Booster for Pfizer series) 02/02/2022 (Originally 02/16/2021)   Hepatitis C Screening  11/01/2022 (Originally 09/20/1988)   HIV Screening  11/01/2022 (Originally 09/20/1985)   COLONOSCOPY (Pts 45-90yrs Insurance coverage will need to be confirmed)  04/07/2023   TETANUS/TDAP  11/20/2025   INFLUENZA VACCINE  Completed   HPV VACCINES  Aged Out      Review of Systems Patient reports no vision/ hearing changes, adenopathy,fever, persistent/recurrent hoarseness , swallowing issues, chest pain, palpitations, edema, persistant/recurrent cough, hemoptysis, dyspnea (rest/exertional/paroxysmal nocturnal), gastrointestinal bleeding (melena, rectal bleeding), abdominal pain, significant heartburn, bowel changes, GU symptoms (dysuria, hematuria, incontinence), Gyn symptoms (abnormal  bleeding, pain),  syncope, focal weakness, memory loss, numbness & tingling, skin/hair/nail changes, abnormal bruising or bleeding, anxiety, or depression.   + 9 lb weight loss  This visit occurred during the SARS-CoV-2 public health emergency.  Safety protocols were in place, including screening questions prior to the visit, additional usage of staff PPE, and extensive cleaning of exam room while observing appropriate contact time as indicated for disinfecting solutions.      Objective:   Physical Exam General Appearance:    Alert, cooperative, no distress, appears stated age, obese  Head:     Normocephalic, without obvious abnormality, atraumatic  Eyes:    PERRL, conjunctiva/corneas clear, EOM's intact, fundi    benign, both eyes  Ears:    Normal TM's and external ear canals, both ears  Nose:   Deferred due to COVID  Throat:   Neck:   Supple, symmetrical, trachea midline, no adenopathy;    Thyroid: no enlargement/tenderness/nodules  Back:     Symmetric, no curvature, ROM normal, no CVA tenderness  Lungs:     Clear to auscultation bilaterally, respirations unlabored  Chest Wall:    No tenderness or deformity   Heart:    Regular rate and rhythm, S1 and S2 normal, no murmur, rub   or gallop  Breast Exam:    Deferred to GYN  Abdomen:     Soft, non-tender, bowel sounds active all four quadrants,    no masses, no organomegaly  Genitalia:    Deferred to GYN  Rectal:    Extremities:   Extremities normal, atraumatic, no cyanosis or edema  Pulses:   2+ and symmetric all extremities  Skin:   Skin color, texture, turgor normal, no rashes or lesions  Lymph nodes:   Cervical, supraclavicular, and axillary nodes normal  Neurologic:   CNII-XII intact, normal strength, sensation and reflexes    throughout          Assessment & Plan:

## 2022-02-11 ENCOUNTER — Other Ambulatory Visit: Payer: Self-pay | Admitting: Family Medicine

## 2022-02-13 ENCOUNTER — Other Ambulatory Visit: Payer: Self-pay | Admitting: Family Medicine

## 2022-02-14 DIAGNOSIS — M545 Low back pain, unspecified: Secondary | ICD-10-CM | POA: Diagnosis not present

## 2022-03-27 DIAGNOSIS — M47816 Spondylosis without myelopathy or radiculopathy, lumbar region: Secondary | ICD-10-CM | POA: Diagnosis not present

## 2022-05-05 ENCOUNTER — Other Ambulatory Visit: Payer: Self-pay | Admitting: Family Medicine

## 2022-05-27 ENCOUNTER — Other Ambulatory Visit: Payer: Self-pay | Admitting: Family Medicine

## 2022-05-28 ENCOUNTER — Telehealth: Payer: Self-pay

## 2022-05-28 NOTE — Telephone Encounter (Signed)
Patient is requesting a refill of the following medications: Requested Prescriptions   Pending Prescriptions Disp Refills   sertraline (ZOLOFT) 100 MG tablet [Pharmacy Med Name: Sertraline HCl 100 MG Oral Tablet] 90 tablet 0    Sig: Take 1 tablet by mouth once daily    Date of patient request: 05/27/2022 Last office visit: 01/17/2022 Date of last refill: 05/07/2022 Last refill amount: 90 Follow up time period per chart: 01/18/2023

## 2022-05-28 NOTE — Telephone Encounter (Signed)
I attempted to call patient to discuss her medication and refill. LVM to return call to office

## 2022-06-28 DIAGNOSIS — M47816 Spondylosis without myelopathy or radiculopathy, lumbar region: Secondary | ICD-10-CM | POA: Diagnosis not present

## 2022-08-13 DIAGNOSIS — M47816 Spondylosis without myelopathy or radiculopathy, lumbar region: Secondary | ICD-10-CM | POA: Diagnosis not present

## 2022-08-15 ENCOUNTER — Other Ambulatory Visit: Payer: Self-pay | Admitting: Family Medicine

## 2022-08-16 NOTE — Telephone Encounter (Signed)
Patient is requesting a refill of the following medications: Requested Prescriptions   Pending Prescriptions Disp Refills   buPROPion (WELLBUTRIN XL) 300 MG 24 hr tablet [Pharmacy Med Name: buPROPion HCl ER (XL) 300 MG Oral Tablet Extended Release 24 Hour] 90 tablet 0    Sig: Take 1 tablet by mouth once daily    Date of patient request: 08/15/2022 Last office visit: 01/17/2022 Date of last refill: 02/13/2022 Last refill amount: 90 tablets  Follow up time period per chart: 01/18/2023

## 2022-09-14 ENCOUNTER — Other Ambulatory Visit: Payer: Self-pay | Admitting: Family Medicine

## 2022-09-17 ENCOUNTER — Other Ambulatory Visit: Payer: Self-pay

## 2022-09-26 DIAGNOSIS — M47816 Spondylosis without myelopathy or radiculopathy, lumbar region: Secondary | ICD-10-CM | POA: Diagnosis not present

## 2022-09-26 DIAGNOSIS — M7918 Myalgia, other site: Secondary | ICD-10-CM | POA: Diagnosis not present

## 2022-09-26 DIAGNOSIS — Z6841 Body Mass Index (BMI) 40.0 and over, adult: Secondary | ICD-10-CM | POA: Diagnosis not present

## 2022-09-27 ENCOUNTER — Telehealth: Payer: Self-pay | Admitting: Family Medicine

## 2022-09-27 MED ORDER — HYDROCODONE-ACETAMINOPHEN 5-325 MG PO TABS: 1.0000 | ORAL_TABLET | Freq: Four times a day (QID) | ORAL | 0 refills | Status: DC | PRN

## 2022-09-27 NOTE — Telephone Encounter (Signed)
Patient has a phy scheduled for 01/18/23. She called in need a refill on her vicodin, she states she has enough to last her until the end of November but has no refills after that.

## 2022-09-27 NOTE — Telephone Encounter (Signed)
Prescription sent to pharmacy.

## 2022-09-28 ENCOUNTER — Telehealth: Payer: Self-pay

## 2022-09-28 NOTE — Telephone Encounter (Signed)
Called pt to inform her that her prescription was sent to the pharmacy

## 2022-09-28 NOTE — Telephone Encounter (Signed)
Informed that her Hydrocodone 5-325 was approved thru Cover my meds today .

## 2022-11-07 ENCOUNTER — Other Ambulatory Visit: Payer: Self-pay | Admitting: Family Medicine

## 2022-11-23 ENCOUNTER — Other Ambulatory Visit: Payer: Self-pay | Admitting: Family Medicine

## 2022-12-18 ENCOUNTER — Other Ambulatory Visit: Payer: Self-pay | Admitting: Family Medicine

## 2023-01-18 ENCOUNTER — Encounter: Payer: Self-pay | Admitting: Family Medicine

## 2023-01-18 ENCOUNTER — Ambulatory Visit (INDEPENDENT_AMBULATORY_CARE_PROVIDER_SITE_OTHER): Payer: BC Managed Care – PPO | Admitting: Family Medicine

## 2023-01-18 VITALS — BP 130/80 | HR 75 | Temp 97.9°F | Resp 18 | Ht 66.0 in | Wt 292.1 lb

## 2023-01-18 DIAGNOSIS — Z1159 Encounter for screening for other viral diseases: Secondary | ICD-10-CM | POA: Diagnosis not present

## 2023-01-18 DIAGNOSIS — Z23 Encounter for immunization: Secondary | ICD-10-CM | POA: Diagnosis not present

## 2023-01-18 DIAGNOSIS — Z0000 Encounter for general adult medical examination without abnormal findings: Secondary | ICD-10-CM | POA: Diagnosis not present

## 2023-01-18 NOTE — Patient Instructions (Signed)
Follow up in 6 months to recheck weight loss We'll notify you of your lab results and make any changes if needed Work on healthy diet and regular exercise- you can do it! Call with any questions or concerns Stay Safe!  Stay Healthy! Happy New Year!!

## 2023-01-18 NOTE — Progress Notes (Unsigned)
   Subjective:    Patient ID: Monique Gross, female    DOB: [birth year], 52 y.o.   MRN: [id number]  HPI CPE- UTD on colonoscopy, Tdap.  Will get shingles and flu today.  Pt states she will call and schedule mammo  Patient Care Team    Relationship Specialty Notifications Start End  Midge Minium, MD PCP - General Family Medicine  09/28/11   Maisie Fus, MD (Inactive) Consulting Physician Obstetrics and Gynecology  09/27/15      Health Maintenance  Topic Date Due   Hepatitis C Screening  Never done   MAMMOGRAM  09/26/2014   Zoster Vaccines- Shingrix (2 of 2) 03/14/2022   INFLUENZA VACCINE  07/24/2022   COLONOSCOPY (Pts 45-33yr Insurance coverage will need to be confirmed)  04/07/2023   DTaP/Tdap/Td (3 - Td or Tdap) 11/20/2025   HIV Screening  Completed   HPV VACCINES  Aged Out   COVID-19 Vaccine  Discontinued      Review of Systems Patient reports no vision/ hearing changes, adenopathy,fever, persistant/recurrent hoarseness , swallowing issues, chest pain, palpitations, edema, persistant/recurrent cough, hemoptysis, dyspnea (rest/exertional/paroxysmal nocturnal), gastrointestinal bleeding (melena, rectal bleeding), abdominal pain, significant heartburn, bowel changes, GU symptoms (dysuria, hematuria, incontinence), Gyn symptoms (abnormal  bleeding, pain),  syncope, focal weakness, memory loss, numbness & tingling, skin/hair/nail changes, abnormal bruising or bleeding, anxiety, or depression.   + 22 lb weight gain- due to ongoing back pain but now seeing pain management.    Objective:   Physical Exam General Appearance:    Alert, cooperative, no distress, appears stated age, obese  Head:    Normocephalic, without obvious abnormality, atraumatic  Eyes:    PERRL, conjunctiva/corneas clear, EOM's intact both eyes  Ears:    Normal TM's and external ear canals, both ears  Nose:   Nares normal, septum midline, mucosa normal, no drainage    or sinus tenderness  Throat:   Lips,  mucosa, and tongue normal; teeth and gums normal  Neck:   Supple, symmetrical, trachea midline, no adenopathy;    Thyroid: no enlargement/tenderness/nodules  Back:     Symmetric, no curvature, ROM normal, no CVA tenderness  Lungs:     Clear to auscultation bilaterally, respirations unlabored  Chest Wall:    No tenderness or deformity   Heart:    Regular rate and rhythm, S1 and S2 normal, no murmur, rub   or gallop  Breast Exam:    Deferred to GYN  Abdomen:     Soft, non-tender, bowel sounds active all four quadrants,    no masses, no organomegaly  Genitalia:    Deferred to GYN  Rectal:    Extremities:   Extremities normal, atraumatic, no cyanosis or edema  Pulses:   2+ and symmetric all extremities  Skin:   Skin color, texture, turgor normal, no rashes or lesions  Lymph nodes:   Cervical, supraclavicular, and axillary nodes normal  Neurologic:   CNII-XII intact, normal strength, sensation and reflexes    throughout          Assessment & Plan:

## 2023-01-19 LAB — CBC WITH DIFFERENTIAL/PLATELET
Absolute Monocytes: 380 cells/uL (ref 200–950)
Basophils Absolute: 39 cells/uL (ref 0–200)
Basophils Relative: 0.7 %
Eosinophils Absolute: 121 cells/uL (ref 15–500)
Eosinophils Relative: 2.2 %
HCT: 40.1 % (ref 35.0–45.0)
Hemoglobin: 13.7 g/dL (ref 11.7–15.5)
Lymphs Abs: 1551 cells/uL (ref 850–3900)
MCH: 29.2 pg (ref 27.0–33.0)
MCHC: 34.2 g/dL (ref 32.0–36.0)
MCV: 85.5 fL (ref 80.0–100.0)
MPV: 10.7 fL (ref 7.5–12.5)
Monocytes Relative: 6.9 %
Neutro Abs: 3410 cells/uL (ref 1500–7800)
Neutrophils Relative %: 62 %
Platelets: 226 10*3/uL (ref 140–400)
RBC: 4.69 10*6/uL (ref 3.80–5.10)
RDW: 12.6 % (ref 11.0–15.0)
Total Lymphocyte: 28.2 %
WBC: 5.5 10*3/uL (ref 3.8–10.8)

## 2023-01-19 LAB — LIPID PANEL
Cholesterol: 200 mg/dL — ABNORMAL HIGH (ref <200)
HDL: 64 mg/dL (ref >=50)
LDL Cholesterol (Calc): 102 mg/dL (calc) — ABNORMAL HIGH
Non-HDL Cholesterol (Calc): 136 mg/dL (calc) — ABNORMAL HIGH (ref <130)
Total CHOL/HDL Ratio: 3.1 (calc) (ref <5.0)
Triglycerides: 220 mg/dL — ABNORMAL HIGH (ref <150)

## 2023-01-19 LAB — HEPATIC FUNCTION PANEL
AG Ratio: 1.6 (calc) (ref 1.0–2.5)
ALT: 14 U/L (ref 6–29)
AST: 12 U/L (ref 10–35)
Albumin: 4.1 g/dL (ref 3.6–5.1)
Alkaline phosphatase (APISO): 87 U/L (ref 37–153)
Bilirubin, Direct: 0.1 mg/dL (ref 0.0–0.2)
Globulin: 2.6 g/dL (calc) (ref 1.9–3.7)
Indirect Bilirubin: 0.2 mg/dL (calc) (ref 0.2–1.2)
Total Bilirubin: 0.3 mg/dL (ref 0.2–1.2)
Total Protein: 6.7 g/dL (ref 6.1–8.1)

## 2023-01-19 LAB — BASIC METABOLIC PANEL
BUN: 15 mg/dL (ref 7–25)
CO2: 23 mmol/L (ref 20–32)
Calcium: 9 mg/dL (ref 8.6–10.4)
Chloride: 105 mmol/L (ref 98–110)
Creat: 0.77 mg/dL (ref 0.50–1.03)
Glucose, Bld: 170 mg/dL — ABNORMAL HIGH (ref 65–99)
Potassium: 3.9 mmol/L (ref 3.5–5.3)
Sodium: 140 mmol/L (ref 135–146)

## 2023-01-19 LAB — HEMOGLOBIN A1C
Hgb A1c MFr Bld: 7.7 % of total Hgb — ABNORMAL HIGH (ref <5.7)
Mean Plasma Glucose: 174 mg/dL
eAG (mmol/L): 9.7 mmol/L

## 2023-01-19 LAB — VITAMIN D 25 HYDROXY (VIT D DEFICIENCY, FRACTURES): Vit D, 25-Hydroxy: 25 ng/mL — ABNORMAL LOW (ref 30–100)

## 2023-01-19 LAB — HEPATITIS C ANTIBODY: Hepatitis C Ab: NONREACTIVE

## 2023-01-19 LAB — TSH: TSH: 4.55 mIU/L — ABNORMAL HIGH

## 2023-01-19 NOTE — Assessment & Plan Note (Signed)
Pt's PE WNL w/ exception of obesity.  UTD on colonoscopy, Tdap.  Flu and shingles given today.  Pt to schedule mammo w/ GYN.  Check labs.  Anticipatory guidance provided.

## 2023-01-19 NOTE — Assessment & Plan Note (Signed)
Deteriorated.  Pt has gained 22 lbs since last visit.  This is due to a combination of ongoing back pain that limits exercise and poor eating.  Pt is now seeing pain management and hoping to be able to be more physically active.  Encouraged low carb diet.  Check labs to risk stratify.  Will follow.

## 2023-01-21 ENCOUNTER — Encounter: Payer: Self-pay | Admitting: Family Medicine

## 2023-01-21 ENCOUNTER — Other Ambulatory Visit: Payer: Self-pay

## 2023-01-21 ENCOUNTER — Telehealth: Payer: Self-pay

## 2023-01-21 DIAGNOSIS — E038 Other specified hypothyroidism: Secondary | ICD-10-CM

## 2023-01-21 DIAGNOSIS — R7989 Other specified abnormal findings of blood chemistry: Secondary | ICD-10-CM

## 2023-01-21 MED ORDER — VITAMIN D (ERGOCALCIFEROL) 1.25 MG (50000 UNIT) PO CAPS: 50000.0000 [IU] | ORAL_CAPSULE | ORAL | 12 refills | Status: DC

## 2023-01-21 MED ORDER — LEVOTHYROXINE SODIUM 125 MCG PO TABS: 125.0000 ug | ORAL_TABLET | Freq: Every day | ORAL | 3 refills | Status: DC

## 2023-01-21 MED ORDER — TIRZEPATIDE 2.5 MG/0.5ML ~~LOC~~ SOAJ: 2.5000 mg | SUBCUTANEOUS | 0 refills | Status: DC

## 2023-01-21 MED ORDER — TIRZEPATIDE 5 MG/0.5ML ~~LOC~~ SOAJ: 5.0000 mg | SUBCUTANEOUS | 1 refills | Status: DC

## 2023-01-21 MED ORDER — HYDROCODONE-ACETAMINOPHEN 5-325 MG PO TABS: 1.0000 | ORAL_TABLET | Freq: Four times a day (QID) | ORAL | 0 refills | Status: DC | PRN

## 2023-01-21 NOTE — Telephone Encounter (Signed)
Informed pt of lab results and we made her an apt to return in 6 to 8 weeks with Dr Birdie Riddle to recheck on [facility] and repeat TSH. Vit  d 50,000 and Levothyroxine 125 has been sent in

## 2023-01-21 NOTE — Addendum Note (Signed)
Addended by: Midge Minium on: 01/21/2023 01:07 PM   Modules accepted: Orders

## 2023-01-21 NOTE — Telephone Encounter (Signed)
Pt reviewed labs and is concerned about her sugars and if she should be on a weight loss med

## 2023-01-21 NOTE — Telephone Encounter (Signed)
Pt reports needs a short supply of 10 then after 2 weeks to have the 50 additional sent in due to new laws surrounding controlled substance.    Please advise

## 2023-01-21 NOTE — Telephone Encounter (Signed)
-----  Message from Midge Minium, MD sent at 01/21/2023  1:07 PM EST ----- Your A1C has jumped from the pre-diabetes range to diabetes.  Based on this, it would make sense for use to start one of the injectable diabetes medications that also helps w/ weight loss.  We will start Mounjaro 2.'5mg'$  weekly x4 weeks and then increase to '5mg'$  weekly x4 weeks.  (I sent this to your pharmacy).  At the 6-8 week mark, we will need a follow up to see how things are going.  Please watch your carb and sugar intake and work on regular physical activity.  Your TSH is mildly elevated at 4.55.  Based on this, we need to increase your levothyroxine to 156mg daily (#30, 3 refills) and we'll repeat your TSH at the appt in 6-8 weeks  Your Vit D level is low.  Based on this, we need to start 50,000 units weekly x12 weeks in addition to daily OTC supplement of at least 2000 units.    Remainder of labs are stable.

## 2023-01-25 ENCOUNTER — Encounter: Payer: Self-pay | Admitting: Family Medicine

## 2023-02-04 ENCOUNTER — Other Ambulatory Visit: Payer: Self-pay | Admitting: Family Medicine

## 2023-02-12 MED ORDER — HYDROCODONE-ACETAMINOPHEN 5-325 MG PO TABS: 1.0000 | ORAL_TABLET | Freq: Four times a day (QID) | ORAL | 0 refills | Status: DC | PRN

## 2023-02-12 NOTE — Addendum Note (Signed)
Addended by: Midge Minium on: 02/12/2023 12:16 PM   Modules accepted: Orders

## 2023-02-13 ENCOUNTER — Ambulatory Visit: Payer: BC Managed Care – PPO | Admitting: Podiatry

## 2023-02-13 ENCOUNTER — Encounter: Payer: Self-pay | Admitting: Podiatry

## 2023-02-13 DIAGNOSIS — L600 Ingrowing nail: Secondary | ICD-10-CM

## 2023-02-13 DIAGNOSIS — M2041 Other hammer toe(s) (acquired), right foot: Secondary | ICD-10-CM | POA: Diagnosis not present

## 2023-02-13 DIAGNOSIS — M722 Plantar fascial fibromatosis: Secondary | ICD-10-CM

## 2023-02-13 MED ORDER — TRIAMCINOLONE ACETONIDE 10 MG/ML IJ SUSP
10.0000 mg | Freq: Once | INTRAMUSCULAR | Status: AC
  Administered 2023-02-13: 10 mg

## 2023-02-13 NOTE — Patient Instructions (Signed)

## 2023-02-13 NOTE — Progress Notes (Signed)
Subjective:   Patient ID: Monique Gross, female   DOB: 52 y.o.   MRN: [id number]   HPI Patient presents with long-term history of heel pain right 1 year duration with soreness with damage big toenails bilateral and was just diagnosed with diabetes and is going to start medications to help with this.  Patient does have obesity is complicating factor smokes half pack per day wants to be more active with last A1c being 7.7   Review of Systems  All other systems reviewed and are negative.       Objective:  Physical Exam Vitals and nursing note reviewed.  Constitutional:      Appearance: She is well-developed.  Pulmonary:     Effort: Pulmonary effort is normal.  Musculoskeletal:        General: Normal range of motion.  Skin:    General: Skin is warm.  Neurological:     Mental Status: She is alert.     Neurovascular status intact muscle strength adequate range of motion within normal limits with exquisite discomfort medial fascial band right at the insertional point tendon calcaneus with fluid buildup around the medial band and also damage big toenails bilateral that are increasingly a problem for with good digital perfusion well-oriented x 3     Assessment:  Acute Planter fasciitis right with inflammation fluid buildup along with damaged hallux nailbeds bilateral     Plan:  H&P both conditions reviewed discussed and today I went ahead did sterile prep injected the fascia right 3 mg Kenalog 5 mg Xylocaine applied fascial brace to lift up the arch discussed possible orthotics and also possible long-term removal of the hallux nailbeds.  Patient will be seen back to reevaluate with decision that can be made  X-rays indicate spur formation no indication stress fracture arthritis

## 2023-02-14 ENCOUNTER — Telehealth: Payer: Self-pay

## 2023-02-14 ENCOUNTER — Encounter: Payer: Self-pay | Admitting: Family Medicine

## 2023-02-14 ENCOUNTER — Other Ambulatory Visit: Payer: Self-pay

## 2023-02-14 MED ORDER — METFORMIN HCL 500 MG PO TABS: 500.0000 mg | ORAL_TABLET | Freq: Two times a day (BID) | ORAL | 3 refills | Status: DC

## 2023-02-14 NOTE — Telephone Encounter (Signed)
Sent in Metformin 580m twice daily . Spoke with pt and she had concerns of side effects of this medication but agreed to start .

## 2023-02-14 NOTE — Telephone Encounter (Signed)
The claim for pt Rx Monunjaro has been denied by the provider review . Pt is asking what should we do now if regards to her diabetes ?

## 2023-02-14 NOTE — Telephone Encounter (Signed)
After speaking to the [facility] rep yesterday, all prescriptions will be denied if they haven't tried Metformin first.  So we need to start Metformin 552m twice daily (#60, 3 refills) and [facility]

## 2023-02-22 ENCOUNTER — Encounter: Payer: Self-pay | Admitting: Family Medicine

## 2023-02-27 ENCOUNTER — Other Ambulatory Visit: Payer: Self-pay

## 2023-02-27 ENCOUNTER — Other Ambulatory Visit: Payer: Self-pay | Admitting: Family Medicine

## 2023-02-27 ENCOUNTER — Encounter: Payer: Self-pay | Admitting: Podiatry

## 2023-02-27 ENCOUNTER — Ambulatory Visit: Payer: BC Managed Care – PPO | Admitting: Podiatry

## 2023-02-27 DIAGNOSIS — M722 Plantar fascial fibromatosis: Secondary | ICD-10-CM

## 2023-02-27 DIAGNOSIS — L600 Ingrowing nail: Secondary | ICD-10-CM

## 2023-02-27 MED ORDER — SERTRALINE HCL 100 MG PO TABS: 100.0000 mg | ORAL_TABLET | Freq: Every day | ORAL | 0 refills | Status: DC

## 2023-02-27 NOTE — Progress Notes (Signed)
Subjective:   Patient ID: Monique Gross, female   DOB: 52 y.o.   MRN: [id number]   HPI Patient presents with chronic heel pain of right foot and also damage big toenails of both feet which become sore and make it hard to wear shoe gear and she is lost them and had to remove them for self and wants them taken care of permanently   ROS      Objective:  Physical Exam  Neurovascular status intact medial heel pain we worked on doing better I did note some central lateral pain but hopefully it is more just compensatory will get better over time with severely damaged hallux nails both feet that are incurvated and thick     Assessment:  Plantar fascial inflammation right with patient who needs to be active due to moderate obesity and diabetes along with chronic nail disease hallux bilateral     Plan:  Reviewed both conditions and for the heel pain I do want her more active she does have arch drop so we went ahead and casted her for functional orthotics.  I then went ahead today discussed nail removal she wants to have this done I recommended both of them to be removed I explained procedure risk patient wants surgery understanding risk signed consent form and I infiltrated each big toe 60 mg like Marcaine mixture sterile prep done using sterile instrumentation remove the hallux nails exposed matrix applied phenol 5 applications 30 seconds followed by alcohol lavage sterile dressing gave instructions on soaks leave dressings on 24 hours take them off earlier if throbbing were to occur encouraged to call questions concerns

## 2023-02-27 NOTE — Telephone Encounter (Signed)
Patient is requesting a refill of the following medications: Requested Prescriptions   Pending Prescriptions Disp Refills   sertraline (ZOLOFT) 100 MG tablet [Pharmacy Med Name: Sertraline HCl 100 MG Oral Tablet] 90 tablet 0    Sig: Take 1 tablet by mouth once daily    Date of patient request: 02/27/2023 Last office visit: 01/18/2023 Date of last refill: 11/23/2022 Last refill amount: [place]

## 2023-02-27 NOTE — Patient Instructions (Signed)

## 2023-02-28 ENCOUNTER — Encounter: Payer: Self-pay | Admitting: Family Medicine

## 2023-02-28 ENCOUNTER — Encounter: Payer: Self-pay | Admitting: Podiatry

## 2023-02-28 DIAGNOSIS — Z1231 Encounter for screening mammogram for malignant neoplasm of breast: Secondary | ICD-10-CM

## 2023-03-01 ENCOUNTER — Ambulatory Visit: Payer: BC Managed Care – PPO | Admitting: Family Medicine

## 2023-03-05 ENCOUNTER — Encounter: Payer: Self-pay | Admitting: Family Medicine

## 2023-03-07 ENCOUNTER — Ambulatory Visit: Payer: BC Managed Care – PPO | Admitting: Podiatry

## 2023-03-07 ENCOUNTER — Encounter: Payer: Self-pay | Admitting: Gastroenterology

## 2023-03-07 DIAGNOSIS — L03031 Cellulitis of right toe: Secondary | ICD-10-CM

## 2023-03-07 MED ORDER — DOXYCYCLINE HYCLATE 100 MG PO TABS: 100.0000 mg | ORAL_TABLET | Freq: Two times a day (BID) | ORAL | 0 refills | Status: DC

## 2023-03-08 NOTE — Progress Notes (Signed)
Subjective:   Patient ID: Monique Gross, female   DOB: 52 y.o.   MRN: [id number]   HPI Patient presents stating she is developed some redness around the right big toe and it was throbbing and she is concerned about infection after having nail removal 8 days ago   ROS      Objective:  Physical Exam  Neuro vascular status intact with some redness around the right hallux nail bed of the localized nature no proximal edema erythema drainage noted     Assessment:  Paronychia infection right hallux localized no indications of other pathology     Plan:  H&P reviewed continued soaks bandage usage and precautionary placed on doxycycline twice daily.  Patient will be seen back to recheck

## 2023-03-12 ENCOUNTER — Other Ambulatory Visit: Payer: Self-pay | Admitting: Podiatry

## 2023-03-12 DIAGNOSIS — M722 Plantar fascial fibromatosis: Secondary | ICD-10-CM

## 2023-03-28 ENCOUNTER — Encounter: Payer: Self-pay | Admitting: Family Medicine

## 2023-03-28 ENCOUNTER — Ambulatory Visit: Payer: BC Managed Care – PPO | Admitting: Family Medicine

## 2023-03-28 VITALS — BP 126/80 | HR 69 | Temp 98.4°F | Resp 17 | Ht 66.0 in | Wt 277.0 lb

## 2023-03-28 DIAGNOSIS — L03032 Cellulitis of left toe: Secondary | ICD-10-CM | POA: Diagnosis not present

## 2023-03-28 DIAGNOSIS — R5383 Other fatigue: Secondary | ICD-10-CM

## 2023-03-28 DIAGNOSIS — E119 Type 2 diabetes mellitus without complications: Secondary | ICD-10-CM

## 2023-03-28 LAB — CBC WITH DIFFERENTIAL/PLATELET
Basophils Absolute: 0 10*3/uL (ref 0.0–0.1)
Basophils Relative: 0.7 % (ref 0.0–3.0)
Eosinophils Absolute: 0.1 10*3/uL (ref 0.0–0.7)
Eosinophils Relative: 1.7 % (ref 0.0–5.0)
HCT: 42.5 % (ref 36.0–46.0)
Hemoglobin: 14.3 g/dL (ref 12.0–15.0)
Lymphocytes Relative: 24 % (ref 12.0–46.0)
Lymphs Abs: 1.4 10*3/uL (ref 0.7–4.0)
MCHC: 33.6 g/dL (ref 30.0–36.0)
MCV: 86.6 fl (ref 78.0–100.0)
Monocytes Absolute: 0.4 10*3/uL (ref 0.1–1.0)
Monocytes Relative: 7.1 % (ref 3.0–12.0)
Neutro Abs: 3.9 10*3/uL (ref 1.4–7.7)
Neutrophils Relative %: 66.5 % (ref 43.0–77.0)
Platelets: 242 10*3/uL (ref 150.0–400.0)
RBC: 4.91 Mil/uL (ref 3.87–5.11)
RDW: 14 % (ref 11.5–15.5)
WBC: 5.8 10*3/uL (ref 4.0–10.5)

## 2023-03-28 LAB — BASIC METABOLIC PANEL
BUN: 19 mg/dL (ref 6–23)
CO2: 29 mEq/L (ref 19–32)
Calcium: 10.1 mg/dL (ref 8.4–10.5)
Chloride: 102 mEq/L (ref 96–112)
Creatinine, Ser: 0.86 mg/dL (ref 0.40–1.20)
GFR: 77.62 mL/min (ref >60.00)
Glucose, Bld: 113 mg/dL — ABNORMAL HIGH (ref 70–99)
Potassium: 4 mEq/L (ref 3.5–5.1)
Sodium: 140 mEq/L (ref 135–145)

## 2023-03-28 LAB — MICROALBUMIN / CREATININE URINE RATIO
Creatinine,U: 167.9 mg/dL
Microalb Creat Ratio: 0.8 mg/g (ref 0.0–30.0)
Microalb, Ur: 1.3 mg/dL (ref 0.0–1.9)

## 2023-03-28 LAB — HEMOGLOBIN A1C: Hgb A1c MFr Bld: 6.9 % — ABNORMAL HIGH (ref 4.6–6.5)

## 2023-03-28 LAB — TSH: TSH: 4.42 u[IU]/mL (ref 0.35–5.50)

## 2023-03-28 MED ORDER — CEPHALEXIN 500 MG PO CAPS: 500.0000 mg | ORAL_CAPSULE | Freq: Three times a day (TID) | ORAL | 0 refills | Status: AC

## 2023-03-28 MED ORDER — TIRZEPATIDE 2.5 MG/0.5ML ~~LOC~~ SOAJ: 2.5000 mg | SUBCUTANEOUS | 0 refills | Status: DC

## 2023-03-28 NOTE — Progress Notes (Signed)
   Subjective:    Patient ID: Monique Gross, female    DOB: 1971/07/25, 52 y.o.   MRN: [id number]  HPI DM- new problem, last A1C was 7.7%  Was not able to get [facility] approved.  Could not tolerate Metformin.  Is down 15 lbs since last visit.  Due for eye exam.  Needs microalbumin.  Went to podiatry- UTD on foot exam.  No CP, SOB, HA's, visual changes, abd pain, N/V.  Denies numbness/tingling of hands/feet.  + fatigue.  Toenail removal- pt had both great toenails removed and now has redness and tenderness at level of nail bed.  Removal occurred 4 weeks ago.  Area continues to drain.   Review of Systems For ROS see HPI     Objective:   Physical Exam Vitals reviewed.  Constitutional:      General: She is not in acute distress.    Appearance: Normal appearance. She is well-developed. She is obese. She is not ill-appearing.  HENT:     Head: Normocephalic and atraumatic.  Eyes:     Conjunctiva/sclera: Conjunctivae normal.     Pupils: Pupils are equal, round, and reactive to light.  Neck:     Thyroid: No thyromegaly.  Cardiovascular:     Rate and Rhythm: Normal rate and regular rhythm.     Pulses: Normal pulses.     Heart sounds: Normal heart sounds. No murmur heard. Pulmonary:     Effort: Pulmonary effort is normal. No respiratory distress.     Breath sounds: Normal breath sounds.  Abdominal:     General: There is no distension.     Palpations: Abdomen is soft.     Tenderness: There is no abdominal tenderness.  Musculoskeletal:     Cervical back: Normal range of motion and neck supple.  Lymphadenopathy:     Cervical: No cervical adenopathy.  Skin:    General: Skin is warm and dry.     Findings: Erythema (at the base of nail bed of great toes bilaterally) present.  Neurological:     General: No focal deficit present.     Mental Status: She is alert and oriented to person, place, and time.  Psychiatric:        Mood and Affect: Mood normal.        Behavior: Behavior normal.            Assessment & Plan:  Cellulitis- new.  Pt w/ redness at base of great toe nailbeds bilaterally w/ TTP and some induration.  Start Keflex and encouraged her to f/u w/ Podiatry.  Pt expressed understanding and is in agreement w/ plan.

## 2023-03-28 NOTE — Patient Instructions (Signed)
Follow up in 3-4 months to recheck sugar We'll notify you of your lab results and make any changes if needed Keep up the good work on healthy diet and regular exercise- I'M SO PROUD OF YOU!!! Call and schedule an eye exam and have them send me a copy of their report START the Cephalexin 3x/day for possible toe infections Call with any questions or concerns Hang in there!!!

## 2023-03-29 ENCOUNTER — Telehealth: Payer: Self-pay

## 2023-03-29 NOTE — Telephone Encounter (Signed)
-----   Message from Sheliah Hatch, MD sent at 03/29/2023  7:21 AM EDT ----- Labs look great!  A1C has improved from 7.7 --> 6.9% w/ all of your efforts!  This is awesome!

## 2023-03-29 NOTE — Telephone Encounter (Signed)
Pt seen results Via my chart  

## 2023-03-29 NOTE — Assessment & Plan Note (Signed)
Chronic problem.  Pt has lost 15 lbs since last visit through diet and exercise alone!  She was not able to tolerate Metformin due to GI side effects and was not able to get [facility] approved.  Will try again to prescribe medication.  Pt to schedule eye exam.  Microalbumin ordered.  UTD on foot exam.  Check labs.

## 2023-03-29 NOTE — Assessment & Plan Note (Signed)
Pt reports she is 'disney tired' at the end of the day.  She is not sure if this is due to her increased activity level, diabetes, thyroid, or something else.  We did adjust her levothyroxine at last visit.  Will check labs to r/o underlying cause and tx prn.  Pt expressed understanding and is in agreement w/ plan.

## 2023-04-03 ENCOUNTER — Telehealth: Payer: Self-pay | Admitting: Podiatry

## 2023-04-03 NOTE — Telephone Encounter (Signed)
Lmom to call back to set up appt to pick up orthotics    Balance is 457.52

## 2023-04-08 ENCOUNTER — Telehealth: Payer: Self-pay

## 2023-04-08 ENCOUNTER — Encounter: Payer: Self-pay | Admitting: Family Medicine

## 2023-04-08 NOTE — Telephone Encounter (Signed)
Pt is asking if her PA has been submitted for the [facility] ? Can someone please let me know

## 2023-04-11 ENCOUNTER — Telehealth: Payer: Self-pay | Admitting: Pharmacy Technician

## 2023-04-11 NOTE — Telephone Encounter (Signed)
Patient Advocate Encounter   Received notification that prior authorization for [facility] 2.5MG /0.5ML pen-injectors is required.   PA submitted on 04/11/2023 Key [id number] Insurance [employer] [place] Commercial Electronic Request Form Status is pending

## 2023-04-11 NOTE — Telephone Encounter (Signed)
Left pt a VM stating the Rx PA has been submitted and status is pending  

## 2023-04-11 NOTE — Telephone Encounter (Signed)
Status of PA or other brief notes, documented(ing) in separate encounter 

## 2023-04-11 NOTE — Telephone Encounter (Signed)
Left pt a VM stating the Rx PA has been submitted and status is pending

## 2023-04-12 ENCOUNTER — Ambulatory Visit: Payer: BC Managed Care – PPO

## 2023-04-12 NOTE — Telephone Encounter (Signed)
Pt is requesting alternative diabetes mediation to the [facility] as it is taking too long and she states she hasn't found anywhere with stock.   Also requested no metformin

## 2023-04-15 MED ORDER — RYBELSUS 7 MG PO TABS: 7.0000 mg | ORAL_TABLET | Freq: Every day | ORAL | 1 refills | Status: DC

## 2023-04-15 MED ORDER — RYBELSUS 3 MG PO TABS: 3.0000 mg | ORAL_TABLET | Freq: Every day | ORAL | 0 refills | Status: DC

## 2023-04-15 NOTE — Telephone Encounter (Signed)
PA Approved

## 2023-04-15 NOTE — Telephone Encounter (Signed)
Left pt a VM stating her Rx Monunjaro was approved

## 2023-04-17 ENCOUNTER — Ambulatory Visit
Admission: RE | Admit: 2023-04-17 | Discharge: 2023-04-17 | Disposition: A | Discharge status: Home / Self Care | Payer: BC Managed Care – PPO | Source: Ambulatory Visit | Attending: Family Medicine | Admitting: Family Medicine

## 2023-04-17 DIAGNOSIS — Z1231 Encounter for screening mammogram for malignant neoplasm of breast: Secondary | ICD-10-CM | POA: Diagnosis not present

## 2023-04-18 ENCOUNTER — Encounter: Payer: Self-pay | Admitting: Gastroenterology

## 2023-04-24 NOTE — Telephone Encounter (Signed)
Patient wanted order cancelled because her insurance will not cover them.  Patient sent mychart message

## 2023-04-30 ENCOUNTER — Encounter: Payer: Self-pay | Admitting: Family Medicine

## 2023-04-30 ENCOUNTER — Other Ambulatory Visit: Payer: Self-pay | Admitting: Family Medicine

## 2023-05-07 ENCOUNTER — Telehealth: Payer: Self-pay

## 2023-05-07 ENCOUNTER — Other Ambulatory Visit: Payer: Self-pay

## 2023-05-07 ENCOUNTER — Other Ambulatory Visit (HOSPITAL_COMMUNITY): Payer: Self-pay

## 2023-05-07 DIAGNOSIS — E119 Type 2 diabetes mellitus without complications: Secondary | ICD-10-CM

## 2023-05-07 MED ORDER — DIAZEPAM 5 MG PO TABS: 5.0000 mg | ORAL_TABLET | Freq: Three times a day (TID) | ORAL | 0 refills | Status: DC | PRN

## 2023-05-07 MED ORDER — TIRZEPATIDE 2.5 MG/0.5ML ~~LOC~~ SOAJ
2.5000 mg | SUBCUTANEOUS | 0 refills | Status: DC
Start: 2023-05-07 — End: 2023-06-14

## 2023-05-07 NOTE — Telephone Encounter (Signed)
Good morning , This pt needs a PA for her Mounjaro 2.5 mg  She does have new insurance I have listed it below  [facility] cross [employer] ID [id number] Group no: [id number]

## 2023-05-07 NOTE — Telephone Encounter (Signed)
Pharmacy Patient Advocate Encounter   Received notification from [facility] that prior authorization for [facility] 2.5MG /0.5ML pen-injectors is required/requested.   PA submitted on 05/07/23 to (ins) [employer] via CoverMyMeds Key or (Medicaid) confirmation # [id number] Status is pending

## 2023-05-07 NOTE — Telephone Encounter (Signed)
PA submitted. Created new encounter for PA. Will update and route back to pool once determination has been made.  

## 2023-05-10 ENCOUNTER — Telehealth: Payer: Self-pay

## 2023-05-10 ENCOUNTER — Other Ambulatory Visit (HOSPITAL_COMMUNITY): Payer: Self-pay

## 2023-05-10 NOTE — Telephone Encounter (Signed)
Informed the pt and advised that her Greggory Keen was approved under her old insurance

## 2023-05-10 NOTE — Telephone Encounter (Signed)
Pt called and states her insurance has changed again and she needs a PA for Greggory Keen this is her new information  My new insurance is [employer],  Member ID. [id number] Group [id number]

## 2023-05-10 NOTE — Telephone Encounter (Signed)
Patient Advocate Encounter  Prior Authorization for [employer] 2.5MG /0.5ML pen-injectors has been approved with [employer].    PA# [id number] Effective dates: 05/10/23 through 05/06/24  Per [facility] test claim, copay for 28 days supply is $25

## 2023-05-13 ENCOUNTER — Other Ambulatory Visit (HOSPITAL_COMMUNITY): Payer: Self-pay

## 2023-05-13 NOTE — Telephone Encounter (Signed)
PA has been approved from 05/13/2023-05/12/2024

## 2023-05-13 NOTE — Telephone Encounter (Signed)
Sent pt a mychart message. 

## 2023-05-13 NOTE — Telephone Encounter (Signed)
Good morning, Friday you got a paid claim on this pt old insurance for her Monique Gross which I explained all of your information on this to the pt on Friday 05/10/23. She is saying when she went to the pharmacy they told her it needed a PA . I once again left a VM stating she needed to run that under the old insurance . Have you heard anything back form the new insurance ?

## 2023-05-13 NOTE — Telephone Encounter (Deleted)
Pharmacy Patient Advocate Encounter   New PA submitted with new insurance information.    PA submitted on 05/13/23 to (ins) [employer] via CoverMyMeds Key or (Medicaid) confirmation # [id number] Status is pending

## 2023-05-13 NOTE — Telephone Encounter (Signed)
Pharmacy Patient Advocate Encounter   Received notification from [facility] that new prior authorization for Mounjaro 2.5MG /0.5ML pen-injectors is required/requested. (New insurance information)   PA submitted on 05/13/23 to (ins) [employer] via CoverMyMeds Key or (Medicaid) confirmation # [id number] Status is pending

## 2023-06-12 ENCOUNTER — Encounter: Payer: Self-pay | Admitting: Gastroenterology

## 2023-06-12 ENCOUNTER — Ambulatory Visit (AMBULATORY_SURGERY_CENTER): Payer: Self-pay

## 2023-06-12 VITALS — Ht 66.0 in | Wt 265.0 lb

## 2023-06-12 DIAGNOSIS — Z86010 Personal history of colonic polyps: Secondary | ICD-10-CM

## 2023-06-12 MED ORDER — PEG 3350-KCL-NA BICARB-NACL 420 G PO SOLR
4000.0000 mL | Freq: Once | ORAL | 0 refills | Status: AC
Start: 2023-06-12 — End: 2023-06-12

## 2023-06-12 NOTE — Progress Notes (Signed)

## 2023-06-12 NOTE — Telephone Encounter (Signed)
I have spoke to the pt . I advised her that per note on 05/13/23 her Wylie Hail has been approved till 05/16/24. She is reaching back out to [employer]

## 2023-06-14 ENCOUNTER — Other Ambulatory Visit: Payer: Self-pay | Admitting: Family Medicine

## 2023-06-14 ENCOUNTER — Encounter: Payer: Self-pay | Admitting: Family Medicine

## 2023-06-14 MED ORDER — TIRZEPATIDE 5 MG/0.5ML ~~LOC~~ SOAJ
5.0000 mg | SUBCUTANEOUS | 1 refills | Status: DC
Start: 2023-06-14 — End: 2023-07-25

## 2023-06-17 DIAGNOSIS — R051 Acute cough: Secondary | ICD-10-CM | POA: Diagnosis not present

## 2023-06-17 DIAGNOSIS — Z20822 Contact with and (suspected) exposure to covid-19: Secondary | ICD-10-CM | POA: Diagnosis not present

## 2023-06-17 DIAGNOSIS — J029 Acute pharyngitis, unspecified: Secondary | ICD-10-CM | POA: Diagnosis not present

## 2023-06-17 NOTE — Addendum Note (Signed)
Addended byJaci Lazier, Luna Audia on: 06/17/2023 12:11 PM   Modules accepted: Orders

## 2023-06-23 ENCOUNTER — Encounter: Payer: Self-pay | Admitting: Family Medicine

## 2023-06-25 ENCOUNTER — Ambulatory Visit: Payer: BC Managed Care – PPO | Admitting: Family Medicine

## 2023-06-25 VITALS — BP 124/92 | HR 76 | Temp 98.9°F | Ht 66.0 in | Wt 276.1 lb

## 2023-06-25 DIAGNOSIS — J069 Acute upper respiratory infection, unspecified: Secondary | ICD-10-CM | POA: Diagnosis not present

## 2023-06-25 MED ORDER — ALBUTEROL SULFATE HFA 108 (90 BASE) MCG/ACT IN AERS: 2.0000 | INHALATION_SPRAY | Freq: Four times a day (QID) | RESPIRATORY_TRACT | 0 refills | Status: DC | PRN

## 2023-06-25 MED ORDER — PREDNISONE 10 MG PO TABS
ORAL_TABLET | ORAL | 0 refills | Status: AC
Start: 2023-06-25 — End: 2023-06-30

## 2023-06-25 NOTE — Progress Notes (Signed)
Acute Office Visit   Subjective:  Patient ID: Fatma Ryser, female    DOB: 08-Nov-1971, 52 y.o.   MRN: [id number]  Chief Complaint  Patient presents with   Cough   Fatigue    Pt is here today with C/O of cough and fatigued for 2 weeks. Pt reports SOB Pt was seen at Urgent Care and was given amoxicillin and this did not help. Pt reports she was dx with dehydration and bronchitis Pt  was neg for COVID,FLU and STREP   HPI:  Patient is a 52 year old female that presents with a non productive cough, fatigued, SHOB only with coughing, sore throat, sinus drainage.   Denies chest pain, ear pain and drainage, and headache.  Low grade fever last week.   Symptoms started 2 weeks ago.   Patient was seen at [facility] on 06/16/2024 and prescribed Amoxicillin-7 day supply for a diagnoses of bronchitis. She was negative for covid, influenza, and strep throat.  She reports her symptoms have not improved. She feels like congestion has moved further down her chest.   Review of Systems  Respiratory:  Positive for cough.    See HPI above      Objective:   BP (!) 124/92   Pulse 76   Temp 98.9 F (37.2 C)   Ht 5\' 6"  (1.676 m)   Wt 276 lb 2 oz (125.2 kg)   LMP 02/06/2013   SpO2 99%   BMI 44.57 kg/m    Physical Exam Vitals reviewed.  Constitutional:      General: She is not in acute distress.    Appearance: Normal appearance. She is obese. She is not ill-appearing, toxic-appearing or diaphoretic.  HENT:     Head: Normocephalic and atraumatic.     Right Ear: Tympanic membrane, ear canal and external ear normal.     Left Ear: Tympanic membrane, ear canal and external ear normal.     Nose:     Right Sinus: No maxillary sinus tenderness or frontal sinus tenderness.     Left Sinus: No maxillary sinus tenderness or frontal sinus tenderness.     Mouth/Throat:     Mouth: Mucous membranes are moist.     Pharynx: Oropharynx is clear. Uvula midline. No pharyngeal swelling,  oropharyngeal exudate, posterior oropharyngeal erythema or uvula swelling.  Eyes:     General:        Right eye: No discharge.        Left eye: No discharge.     Conjunctiva/sclera: Conjunctivae normal.  Cardiovascular:     Rate and Rhythm: Normal rate and regular rhythm.     Heart sounds: Normal heart sounds. No murmur heard.    No friction rub. No gallop.  Pulmonary:     Effort: Pulmonary effort is normal. No respiratory distress.     Breath sounds: Normal breath sounds.     Comments: Non-productive cough during visit  Musculoskeletal:        General: Normal range of motion.  Lymphadenopathy:     Head:     Right side of head: No submental or submandibular adenopathy.     Left side of head: No submental or submandibular adenopathy.  Skin:    General: Skin is warm and dry.  Neurological:     General: No focal deficit present.     Mental Status: She is alert and oriented to person, place, and time. Mental status is at baseline.  Psychiatric:  Mood and Affect: Mood normal.        Behavior: Behavior normal.        Thought Content: Thought content normal.        Judgment: Judgment normal.       Assessment & Plan:  Upper respiratory infection, viral -     predniSONE; Take 6 tablets (60 mg total) by mouth daily with breakfast for 1 day, THEN 5 tablets (50 mg total) daily with breakfast for 1 day, THEN 4 tablets (40 mg total) daily with breakfast for 1 day, THEN 3 tablets (30 mg total) daily with breakfast for 1 day, THEN 2 tablets (20 mg total) daily with breakfast for 1 day, THEN 1 tablet (10 mg total) daily with breakfast for 1 day.  Dispense: 21 tablet; Refill: 0 -     Albuterol Sulfate HFA; Inhale 2 puffs into the lungs every 6 (six) hours as needed for wheezing or shortness of breath.  Dispense: 8 g; Refill: 0  -Prescribed Prednisone 10mg , 6 day taper and Albuterol Inhaler, 2 puffs every 6 hours as needed for [facility] and cough.  -Follow up if not improved.  -Patient  expressed understanding of plan of care and agreed with treatment plan.    Zandra Abts, NP

## 2023-06-25 NOTE — Patient Instructions (Addendum)
-  START Prednisone 6 day taper.  -START Albuterol Inhaler, 2 puffs every 6 hours as needed for shortness of breath and cough.  -Follow up if not improved.

## 2023-06-28 ENCOUNTER — Ambulatory Visit: Payer: BC Managed Care – PPO | Admitting: Family Medicine

## 2023-07-01 ENCOUNTER — Ambulatory Visit (AMBULATORY_SURGERY_CENTER): Payer: BC Managed Care – PPO | Admitting: Gastroenterology

## 2023-07-01 ENCOUNTER — Encounter: Payer: Self-pay | Admitting: Gastroenterology

## 2023-07-01 VITALS — BP 138/66 | HR 65 | Temp 98.0°F | Resp 10 | Ht 66.0 in | Wt 265.0 lb

## 2023-07-01 DIAGNOSIS — Z86010 Personal history of colonic polyps: Secondary | ICD-10-CM

## 2023-07-01 DIAGNOSIS — Z1211 Encounter for screening for malignant neoplasm of colon: Secondary | ICD-10-CM | POA: Diagnosis not present

## 2023-07-01 DIAGNOSIS — Z09 Encounter for follow-up examination after completed treatment for conditions other than malignant neoplasm: Secondary | ICD-10-CM | POA: Diagnosis not present

## 2023-07-01 MED ORDER — SODIUM CHLORIDE 0.9 % IV SOLN
500.00 mL | Freq: Once | INTRAVENOUS | Status: DC
Start: 2023-07-01 — End: 2023-07-01

## 2023-07-01 NOTE — Patient Instructions (Signed)
Handouts provided on diverticulosis and high fiber diet.  Recommend a high fiber diet (see handout). Continue present medications.  Repeat colonoscopy in 5 years (due to prep quality) for surveillance with an extended bowel prep.   YOU HAD AN ENDOSCOPIC PROCEDURE TODAY AT THE  ENDOSCOPY CENTER:   Refer to the procedure report that was given to you for any specific questions about what was found during the examination.  If the procedure report does not answer your questions, please call your gastroenterologist to clarify.  If you requested that your care partner not be given the details of your procedure findings, then the procedure report has been included in a sealed envelope for you to review at your convenience later.  YOU SHOULD EXPECT: Some feelings of bloating in the abdomen. Passage of more gas than usual.  Walking can help get rid of the air that was put into your GI tract during the procedure and reduce the bloating. If you had a lower endoscopy (such as a colonoscopy or flexible sigmoidoscopy) you may notice spotting of blood in your stool or on the toilet paper. If you underwent a bowel prep for your procedure, you may not have a normal bowel movement for a few days.  Please Note:  You might notice some irritation and congestion in your nose or some drainage.  This is from the oxygen used during your procedure.  There is no need for concern and it should clear up in a day or so.  SYMPTOMS TO REPORT IMMEDIATELY:  Following lower endoscopy (colonoscopy or flexible sigmoidoscopy):  Excessive amounts of blood in the stool  Significant tenderness or worsening of abdominal pains  Swelling of the abdomen that is new, acute  Fever of 100F or higher  For urgent or emergent issues, a gastroenterologist can be reached at any hour by calling [phone]. Do not use MyChart messaging for urgent concerns.    DIET:  We do recommend a small meal at first, but then you may proceed to  your regular diet.  Drink plenty of fluids but you should avoid alcoholic beverages for 24 hours.  ACTIVITY:  You should plan to take it easy for the rest of today and you should NOT DRIVE or use heavy machinery until tomorrow (because of the sedation medicines used during the test).    FOLLOW UP: Our staff will call the number listed on your records the next business day following your procedure.  We will call around 7:15- 8:00 am to check on you and address any questions or concerns that you may have regarding the information given to you following your procedure. If we do not reach you, we will leave a message.     If any biopsies were taken you will be contacted by phone or by letter within the next 1-3 weeks.  Please call us at [phone] if you have not heard about the biopsies in 3 weeks.    SIGNATURES/CONFIDENTIALITY: You and/or your care partner have signed paperwork which will be entered into your electronic medical record.  These signatures attest to the fact that that the information above on your After Visit Summary has been reviewed and is understood.  Full responsibility of the confidentiality of this discharge information lies with you and/or your care-partner.

## 2023-07-01 NOTE — Op Note (Signed)
[facility] Patient Name: Monique Gross Procedure Date: 07/01/2023 10:11 AM MRN: [id number] Endoscopist: Meryl Dare , MD, [phone] Age: 52 Referring MD:  Date of Birth: 1971-09-19 Gender: Female Account #: [id number] Procedure:                Colonoscopy Indications:              Surveillance: History of adenomatous polyps,                            inadequate prep on last exam (73yr) Medicines:                Monitored Anesthesia Care Procedure:                Pre-Anesthesia Assessment:                           - Prior to the procedure, a History and Physical                            was performed, and patient medications and                            allergies were reviewed. The patient's tolerance of                            previous anesthesia was also reviewed. The risks                            and benefits of the procedure and the sedation                            options and risks were discussed with the patient.                            All questions were answered, and informed consent                            was obtained. Prior Anticoagulants: The patient has                            taken no anticoagulant or antiplatelet agents. ASA                            Grade Assessment: III - A patient with severe                            systemic disease. After reviewing the risks and                            benefits, the patient was deemed in satisfactory                            condition to undergo the procedure.  After obtaining informed consent, the colonoscope                            was passed under direct vision. Throughout the                            procedure, the patient's blood pressure, pulse, and                            oxygen saturations were monitored continuously. The                            Olympus CF-HQ190L ([id number]) Colonoscope was                            introduced through the  anus and advanced to the the                            cecum, identified by appendiceal orifice and                            ileocecal valve. The ileocecal valve, appendiceal                            orifice, and rectum were photographed. The quality                            of the bowel preparation was adequate after                            extensive lavage, suction. The colonoscopy was                            somewhat difficult due to significant looping and a                            tortuous colon. The patient tolerated the procedure                            well. Scope In: 10:20:30 AM Scope Out: 10:51:15 AM Scope Withdrawal Time: 0 hours 20 minutes 16 seconds  Total Procedure Duration: 0 hours 30 minutes 45 seconds  Findings:                 The perianal and digital rectal examinations were                            normal.                           Multiple medium-mouthed and small-mouthed                            diverticula were found in the left colon. There was  no evidence of diverticular bleeding.                           The exam was otherwise without abnormality on                            direct and retroflexion views. Complications:            No immediate complications. Estimated blood loss:                            None. Estimated Blood Loss:     Estimated blood loss: none. Impression:               - Moderate diverticulosis in the left colon.                           - The examination was otherwise normal on direct                            and retroflexion views.                           - No specimens collected. Recommendation:           - Repeat colonoscopy in 5 years (due to prep                            quality) for surveillance with an extended bowel                            prep.                           - Patient has a contact number available for                            emergencies. The signs and  symptoms of potential                            delayed complications were discussed with the                            patient. Return to normal activities tomorrow.                            Written discharge instructions were provided to the                            patient.                           - High fiber diet.                           - Continue present medications. Meryl Dare, MD 07/01/2023 10:57:50 AM This report has been signed electronically.

## 2023-07-01 NOTE — Progress Notes (Signed)
Pt's states no medical or surgical changes since previsit or office visit. 

## 2023-07-01 NOTE — Progress Notes (Signed)
Pt resting comfortably. VSS. Airway intact. SBAR complete to RN. All questions answered.   

## 2023-07-01 NOTE — Progress Notes (Signed)
History & Physical  Primary Care Physician:  Sheliah Hatch, MD Primary Gastroenterologist: Claudette Head, MD  Impression / Plan:  Personal history of adenomatous colon polyps, colonoscopy 3 years ago with an inadequate bowel prep, for colonoscopy.   CHIEF COMPLAINT:   Personal history of colon polyps   HPI: Kapri Hurless is a 52 y.o. female with a personal history of adenomatous colon polyps, colonoscopy 3 years ago with an inadequate bowel prep, for colonoscopy.   Past Medical History:  Diagnosis Date   Anxiety    Arthritis    back    Chronic kidney disease    hx of kidney stones   Depression    Diverticulosis    Diverticulosis    H/O total vaginal hysterectomy    HTN (hypertension)    IBS (irritable bowel syndrome)    Lumbar disc disease    Migraines    OSA (obstructive sleep apnea)    Sleep apnea    no cpap    Thyroid disease    Tubular adenoma of colon 12/2011   Urolithiasis     Past Surgical History:  Procedure Laterality Date   BACK SURGERY     CESAREAN SECTION     COLONOSCOPY     LITHOTRIPSY     LUMBAR DISC SURGERY  11-2008   PARTIAL HYSTERECTOMY     POLYPECTOMY     TUBAL LIGATION  06/2011    Prior to Admission medications   Medication Sig Start Date End Date Taking? Authorizing Provider  buPROPion (WELLBUTRIN XL) 300 MG 24 hr tablet Take 1 tablet by mouth once daily 04/30/23  Yes Sheliah Hatch, MD  levothyroxine (SYNTHROID) 125 MCG tablet Take 1 tablet (125 mcg total) by mouth daily. 01/21/23  Yes Sheliah Hatch, MD  nicotine polacrilex (COMMIT) 2 MG lozenge Take 2 mg by mouth as needed for smoking cessation.   Yes [provider]  sertraline (ZOLOFT) 100 MG tablet Take 1 tablet (100 mg total) by mouth daily. 02/27/23  Yes Sheliah Hatch, MD  Vitamin D, Ergocalciferol, (DRISDOL) 1.25 MG (50000 UNIT) CAPS capsule Take 1 capsule (50,000 Units total) by mouth every 7 (seven) days. 01/21/23  Yes Sheliah Hatch, MD   albuterol (VENTOLIN HFA) 108 (90 Base) MCG/ACT inhaler Inhale 2 puffs into the lungs every 6 (six) hours as needed for wheezing or shortness of breath. 06/25/23   Alveria Apley, NP  diazepam (VALIUM) 5 MG tablet Take 1 tablet (5 mg total) by mouth every 8 (eight) hours as needed. 05/07/23   Sheliah Hatch, MD  HYDROcodone-acetaminophen (NORCO/VICODIN) 5-325 MG tablet Take 1 tablet by mouth every 6 (six) hours as needed for moderate pain. 02/12/23   Sheliah Hatch, MD  tirzepatide [facility]) 5 MG/0.5ML Pen Inject 5 mg into the skin once a week. 06/14/23   Sheliah Hatch, MD    Current Outpatient Medications  Medication Sig Dispense Refill   buPROPion (WELLBUTRIN XL) 300 MG 24 hr tablet Take 1 tablet by mouth once daily 90 tablet 0   levothyroxine (SYNTHROID) 125 MCG tablet Take 1 tablet (125 mcg total) by mouth daily. 90 tablet 3   nicotine polacrilex (COMMIT) 2 MG lozenge Take 2 mg by mouth as needed for smoking cessation.     sertraline (ZOLOFT) 100 MG tablet Take 1 tablet (100 mg total) by mouth daily. 90 tablet 0   Vitamin D, Ergocalciferol, (DRISDOL) 1.25 MG (50000 UNIT) CAPS capsule Take 1 capsule (50,000 Units total) by mouth  every 7 (seven) days. 7 capsule 12   albuterol (VENTOLIN HFA) 108 (90 Base) MCG/ACT inhaler Inhale 2 puffs into the lungs every 6 (six) hours as needed for wheezing or shortness of breath. 8 g 0   diazepam (VALIUM) 5 MG tablet Take 1 tablet (5 mg total) by mouth every 8 (eight) hours as needed. 30 tablet 0   HYDROcodone-acetaminophen (NORCO/VICODIN) 5-325 MG tablet Take 1 tablet by mouth every 6 (six) hours as needed for moderate pain. 50 tablet 0   tirzepatide (MOUNJARO) 5 MG/0.5ML Pen Inject 5 mg into the skin once a week. 2 mL 1   Current Facility-Administered Medications  Medication Dose Route Frequency Provider Last Rate Last Admin   0.9 %  sodium chloride infusion  500 mL Intravenous Once Meryl Dare, MD        Allergies as of  07/01/2023   (No Known Allergies)    Family History  Problem Relation Age of Onset   Urolithiasis Father    Kidney disease Father        renal stones   Thyroid disease Mother    Thyroid disease Maternal Grandmother    Colon polyps Maternal Grandmother    Diabetes Maternal Grandfather    Colon cancer Neg Hx    Esophageal cancer Neg Hx    Rectal cancer Neg Hx    Stomach cancer Neg Hx     Social History   Socioeconomic History   Marital status: Married    Spouse name: Not on file   Number of children: 1   Years of education: Not on file   Highest education level: Not on file  Occupational History   Occupation: purchasing  Tobacco Use   Smoking status: Former    Packs/day: 0.50    Years: 11.00    Additional pack years: 0.00    Total pack years: 5.50    Types: Cigarettes    Quit date: 12/24/2018    Years since quitting: 4.5   Smokeless tobacco: Never  Vaping Use   Vaping Use: Never used  Substance and Sexual Activity   Alcohol use: No   Drug use: No   Sexual activity: Not on file  Other Topics Concern   Not on file  Social History Narrative   Not on file   Social Determinants of Health   Financial Resource Strain: Not on file  Food Insecurity: Not on file  Transportation Needs: Not on file  Physical Activity: Not on file  Stress: Not on file  Social Connections: Not on file  Intimate Partner Violence: Not on file    Review of Systems:  All systems reviewed were negative except where noted in HPI.   Physical Exam:  General:  Alert, well-developed, in NAD Head:  Normocephalic and atraumatic. Eyes:  Sclera clear, no icterus.   Conjunctiva pink. Ears:  Normal auditory acuity. Mouth:  No deformity or lesions.  Neck:  Supple; no masses. Lungs:  Clear throughout to auscultation.   No wheezes, crackles, or rhonchi.  Heart:  Regular rate and rhythm; no murmurs. Abdomen:  Soft, nondistended, nontender. No masses, hepatomegaly. No palpable masses.  Normal  bowel sounds.    Rectal:  Deferred   Msk:  Symmetrical without gross deformities. Extremities:  Without edema. Neurologic:  Alert and  oriented x 4; grossly normal neurologically. Skin:  Intact without significant lesions or rashes. Psych:  Alert and cooperative. Normal mood and affect.   Venita Lick. Russella Dar  07/01/2023, 10:06 AM See Loretha Stapler, [facility],  to contact our on call provider

## 2023-07-02 ENCOUNTER — Telehealth: Payer: Self-pay

## 2023-07-02 NOTE — Telephone Encounter (Signed)
Follow up call placed, VM obtained and message left. 

## 2023-07-08 ENCOUNTER — Telehealth: Payer: Self-pay | Admitting: Family Medicine

## 2023-07-08 NOTE — Telephone Encounter (Signed)
Patient called and stated that she received a bill from [place] for DOS 06/17/2023 for a shingles vaccine. Patient states she does not remember coming here that day. I did look in her chart and appt desk and do not see that she came in that day, patient also doesn't recall getting the second shingles vaccine. I let her know that I would look into this and get back with her asap.   I did see in her note from 01/18/2023 that a shingles vaccine was ordered but it has a DOS of 06/17/2023, I'm a little confused as well.

## 2023-07-08 NOTE — Telephone Encounter (Signed)
Pt received flu and shingles Vaccine on 01/18/23 immunization form in chart under media and in immunization

## 2023-07-08 NOTE — Telephone Encounter (Signed)
I believe it was ordered and given on 01/18/23.  I think the DOS of 6/24 was when we realized it was recorded as a zostavax and not a shingrix and was corrected.  Actual DOS 01/18/23

## 2023-07-10 ENCOUNTER — Ambulatory Visit: Payer: BC Managed Care – PPO | Admitting: Family Medicine

## 2023-07-19 ENCOUNTER — Ambulatory Visit: Payer: BC Managed Care – PPO | Admitting: Family Medicine

## 2023-07-25 ENCOUNTER — Telehealth: Payer: Self-pay

## 2023-07-25 ENCOUNTER — Other Ambulatory Visit (HOSPITAL_COMMUNITY): Payer: Self-pay

## 2023-07-25 ENCOUNTER — Ambulatory Visit: Payer: BC Managed Care – PPO | Admitting: Family Medicine

## 2023-07-25 ENCOUNTER — Other Ambulatory Visit: Payer: Self-pay | Admitting: Family Medicine

## 2023-07-25 ENCOUNTER — Encounter: Payer: Self-pay | Admitting: Family Medicine

## 2023-07-25 VITALS — BP 130/80 | HR 80 | Temp 98.2°F | Resp 18 | Ht 66.0 in | Wt 273.5 lb

## 2023-07-25 DIAGNOSIS — Z7985 Long-term (current) use of injectable non-insulin antidiabetic drugs: Secondary | ICD-10-CM

## 2023-07-25 DIAGNOSIS — E119 Type 2 diabetes mellitus without complications: Secondary | ICD-10-CM | POA: Diagnosis not present

## 2023-07-25 DIAGNOSIS — E038 Other specified hypothyroidism: Secondary | ICD-10-CM | POA: Diagnosis not present

## 2023-07-25 DIAGNOSIS — I10 Essential (primary) hypertension: Secondary | ICD-10-CM

## 2023-07-25 LAB — BASIC METABOLIC PANEL
BUN: 18 mg/dL (ref 6–23)
CO2: 28 mEq/L (ref 19–32)
Calcium: 9.4 mg/dL (ref 8.4–10.5)
Chloride: 103 mEq/L (ref 96–112)
Creatinine, Ser: 0.81 mg/dL (ref 0.40–1.20)
GFR: 83.21 mL/min (ref >60.00)
Glucose, Bld: 98 mg/dL (ref 70–99)
Potassium: 4.4 mEq/L (ref 3.5–5.1)
Sodium: 138 mEq/L (ref 135–145)

## 2023-07-25 LAB — CBC WITH DIFFERENTIAL/PLATELET
Basophils Absolute: 0.1 10*3/uL (ref 0.0–0.1)
Basophils Relative: 0.8 % (ref 0.0–3.0)
Eosinophils Absolute: 0.1 10*3/uL (ref 0.0–0.7)
Eosinophils Relative: 1.9 % (ref 0.0–5.0)
HCT: 43.8 % (ref 36.0–46.0)
Hemoglobin: 14.3 g/dL (ref 12.0–15.0)
Lymphocytes Relative: 23 % (ref 12.0–46.0)
Lymphs Abs: 1.4 10*3/uL (ref 0.7–4.0)
MCHC: 32.6 g/dL (ref 30.0–36.0)
MCV: 87.3 fl (ref 78.0–100.0)
Monocytes Absolute: 0.4 10*3/uL (ref 0.1–1.0)
Monocytes Relative: 6.3 % (ref 3.0–12.0)
Neutro Abs: 4.2 10*3/uL (ref 1.4–7.7)
Neutrophils Relative %: 68 % (ref 43.0–77.0)
Platelets: 251 10*3/uL (ref 150.0–400.0)
RBC: 5.02 Mil/uL (ref 3.87–5.11)
RDW: 14 % (ref 11.5–15.5)
WBC: 6.1 10*3/uL (ref 4.0–10.5)

## 2023-07-25 LAB — LIPID PANEL
Cholesterol: 195 mg/dL (ref 0–200)
HDL: 59.1 mg/dL (ref >39.00)
LDL Cholesterol: 109 mg/dL — ABNORMAL HIGH (ref 0–99)
NonHDL: 135.55
Total CHOL/HDL Ratio: 3
Triglycerides: 131 mg/dL (ref 0.0–149.0)
VLDL: 26.2 mg/dL (ref 0.0–40.0)

## 2023-07-25 LAB — HEPATIC FUNCTION PANEL
ALT: 21 U/L (ref 0–35)
AST: 18 U/L (ref 0–37)
Albumin: 4.4 g/dL (ref 3.5–5.2)
Alkaline Phosphatase: 69 U/L (ref 39–117)
Bilirubin, Direct: 0.1 mg/dL (ref 0.0–0.3)
Total Bilirubin: 0.4 mg/dL (ref 0.2–1.2)
Total Protein: 7.2 g/dL (ref 6.0–8.3)

## 2023-07-25 LAB — HEMOGLOBIN A1C: Hgb A1c MFr Bld: 6.2 % (ref 4.6–6.5)

## 2023-07-25 LAB — TSH: TSH: 3.26 u[IU]/mL (ref 0.35–5.50)

## 2023-07-25 MED ORDER — OMEPRAZOLE 20 MG PO CPDR: 20.0000 mg | DELAYED_RELEASE_CAPSULE | Freq: Every day | ORAL | 3 refills | Status: AC

## 2023-07-25 MED ORDER — TIRZEPATIDE 7.5 MG/0.5ML ~~LOC~~ SOAJ: 7.5000 mg | SUBCUTANEOUS | 1 refills | Status: DC

## 2023-07-25 NOTE — Assessment & Plan Note (Signed)
Chronic problem.  Reports energy level is good.  Has recent constipation but she attributes this to [facility] and not thyroid.  Check labs.  Adjust meds prn

## 2023-07-25 NOTE — Telephone Encounter (Signed)
Informed pt of lab results  

## 2023-07-25 NOTE — Assessment & Plan Note (Signed)
Ongoing issue.  At this time, pt is off medication and trying to control w/ weight loss.  Will continue to monitor.

## 2023-07-25 NOTE — Assessment & Plan Note (Signed)
Chronic problem.  On Mounjaro 5mg  daily and states she is tolerating w/o difficulty.  She does note some constipation and GERD since starting medication but she has been treating these w/ OTC meds.  Pt interested in increasing Mounjaro to 7.5mg  weekly.  New prescription sent.  She needs to schedule eye exam.  Check labs.  Adjust meds prn

## 2023-07-25 NOTE — Telephone Encounter (Signed)
-----   Message from Neena Rhymes sent at 07/25/2023  4:23 PM EDT ----- Labs look great!  No changes at this time

## 2023-07-25 NOTE — Patient Instructions (Signed)
Follow up in 3-4 months to recheck sugars and weight loss We'll notify you of your lab results and make any changes if needed INCREASE the Mounjaro to 7.5mg  weekly ADD the Omeprazole daily to help w/ reflux Continue to work on low carb diet and regular exercise- so proud of you!!! Call with any questions or concerns Stay Safe!  Stay Healthy! Enjoy the rest of your summer!!!

## 2023-07-25 NOTE — Assessment & Plan Note (Signed)
Ongoing issue for pt.  Joined a gym 3 weeks ago.  Is losing weight w/ addition of Mounjaro.  Applauded her efforts and encouraged her to continue.

## 2023-07-25 NOTE — Progress Notes (Signed)
   Subjective:    Patient ID: Monique Gross, female    DOB: October 07, 1971, 52 y.o.   MRN: [id number]  HPI DM- chronic problem, on Mounjaro 5mg  weekly.  UTD on microalbumin, foot exam.  Due for eye exam.  Pt is tolerating medication w/o difficulty.  Interested in increasing to next dose.  Pt finds herself craving cantaloupe and cottage cheese.  No abd pain, N/V.  + GERD since starting medication.  HTN- attempting to control w/ lifestyle modifications.  No CP, SOB, HA's, visual changes, edema.  Hypothyroid- chronic problem, on Levothyroxine daily.  Some constipation since starting Mounjaro.  Energy level is good  Obesity- joined a gym 3 weeks ago.  Currently on Mounjaro   Review of Systems For ROS see HPI     Objective:   Physical Exam Vitals reviewed.  Constitutional:      General: She is not in acute distress.    Appearance: Normal appearance. She is well-developed. She is obese. She is not ill-appearing.  HENT:     Head: Normocephalic and atraumatic.  Eyes:     Conjunctiva/sclera: Conjunctivae normal.     Pupils: Pupils are equal, round, and reactive to light.  Neck:     Thyroid: No thyromegaly.  Cardiovascular:     Rate and Rhythm: Normal rate and regular rhythm.     Heart sounds: Normal heart sounds. No murmur heard. Pulmonary:     Effort: Pulmonary effort is normal. No respiratory distress.     Breath sounds: Normal breath sounds.  Abdominal:     General: There is no distension.     Palpations: Abdomen is soft.     Tenderness: There is no abdominal tenderness.  Musculoskeletal:     Cervical back: Normal range of motion and neck supple.     Right lower leg: No edema.     Left lower leg: No edema.  Lymphadenopathy:     Cervical: No cervical adenopathy.  Skin:    General: Skin is warm and dry.  Neurological:     General: No focal deficit present.     Mental Status: She is alert and oriented to person, place, and time.  Psychiatric:        Mood and Affect:  Mood normal.        Behavior: Behavior normal.        Thought Content: Thought content normal.           Assessment & Plan:

## 2023-08-08 ENCOUNTER — Encounter: Payer: Self-pay | Admitting: Family Medicine

## 2023-09-15 ENCOUNTER — Other Ambulatory Visit: Payer: Self-pay | Admitting: Family Medicine

## 2023-10-18 ENCOUNTER — Other Ambulatory Visit: Payer: Self-pay | Admitting: Family Medicine

## 2023-11-19 ENCOUNTER — Encounter: Payer: Self-pay | Admitting: Family Medicine

## 2023-11-19 MED ORDER — TIRZEPATIDE 10 MG/0.5ML ~~LOC~~ SOAJ: 10.0000 mg | SUBCUTANEOUS | 1 refills | Status: DC

## 2023-11-19 NOTE — Addendum Note (Signed)
Addended by: Sheliah Hatch on: 11/19/2023 12:08 PM   Modules accepted: Orders

## 2023-12-15 ENCOUNTER — Other Ambulatory Visit: Payer: Self-pay | Admitting: Family Medicine

## 2023-12-27 ENCOUNTER — Encounter: Payer: Self-pay | Admitting: Family Medicine

## 2024-01-03 ENCOUNTER — Other Ambulatory Visit: Payer: Self-pay | Admitting: Family Medicine

## 2024-01-08 ENCOUNTER — Other Ambulatory Visit: Payer: Self-pay | Admitting: Family Medicine

## 2024-01-08 ENCOUNTER — Encounter: Payer: Self-pay | Admitting: Family Medicine

## 2024-01-14 ENCOUNTER — Telehealth: Payer: Self-pay | Admitting: Gastroenterology

## 2024-01-14 NOTE — Telephone Encounter (Signed)
PT is calling to discuss her pre cert from the procedure done on 07/01/2023. The insurance is saying that they never received anything pertaining to the coding and now the PT has two weeks to obtain the information or she will be responsible for the entire bill. Please advise.

## 2024-01-22 NOTE — Telephone Encounter (Signed)
Patient is calling back for message below. Please advise.

## 2024-01-29 DIAGNOSIS — E119 Type 2 diabetes mellitus without complications: Secondary | ICD-10-CM | POA: Diagnosis not present

## 2024-02-11 ENCOUNTER — Other Ambulatory Visit: Payer: Self-pay | Admitting: Family Medicine

## 2024-02-11 DIAGNOSIS — E038 Other specified hypothyroidism: Secondary | ICD-10-CM

## 2024-02-22 ENCOUNTER — Other Ambulatory Visit: Payer: Self-pay | Admitting: Family Medicine

## 2024-02-22 DIAGNOSIS — E038 Other specified hypothyroidism: Secondary | ICD-10-CM

## 2024-03-16 ENCOUNTER — Encounter: Payer: Self-pay | Admitting: Family Medicine

## 2024-03-25 ENCOUNTER — Ambulatory Visit: Admitting: Family Medicine

## 2024-03-25 VITALS — BP 122/80 | HR 75 | Temp 98.9°F | Wt 227.0 lb

## 2024-03-25 DIAGNOSIS — Z7985 Long-term (current) use of injectable non-insulin antidiabetic drugs: Secondary | ICD-10-CM

## 2024-03-25 DIAGNOSIS — E119 Type 2 diabetes mellitus without complications: Secondary | ICD-10-CM

## 2024-03-25 DIAGNOSIS — K581 Irritable bowel syndrome with constipation: Secondary | ICD-10-CM | POA: Diagnosis not present

## 2024-03-25 LAB — HEPATIC FUNCTION PANEL
ALT: 15 U/L (ref 0–35)
AST: 14 U/L (ref 0–37)
Albumin: 4.5 g/dL (ref 3.5–5.2)
Alkaline Phosphatase: 62 U/L (ref 39–117)
Bilirubin, Direct: 0.1 mg/dL (ref 0.0–0.3)
Total Bilirubin: 0.5 mg/dL (ref 0.2–1.2)
Total Protein: 7.2 g/dL (ref 6.0–8.3)

## 2024-03-25 LAB — CBC WITH DIFFERENTIAL/PLATELET
Basophils Absolute: 0 10*3/uL (ref 0.0–0.1)
Basophils Relative: 0.7 % (ref 0.0–3.0)
Eosinophils Absolute: 0.1 10*3/uL (ref 0.0–0.7)
Eosinophils Relative: 1.7 % (ref 0.0–5.0)
HCT: 41.4 % (ref 36.0–46.0)
Hemoglobin: 13.8 g/dL (ref 12.0–15.0)
Lymphocytes Relative: 25.6 % (ref 12.0–46.0)
Lymphs Abs: 1.3 10*3/uL (ref 0.7–4.0)
MCHC: 33.4 g/dL (ref 30.0–36.0)
MCV: 87.7 fl (ref 78.0–100.0)
Monocytes Absolute: 0.4 10*3/uL (ref 0.1–1.0)
Monocytes Relative: 6.7 % (ref 3.0–12.0)
Neutro Abs: 3.4 10*3/uL (ref 1.4–7.7)
Neutrophils Relative %: 65.3 % (ref 43.0–77.0)
Platelets: 198 10*3/uL (ref 150.0–400.0)
RBC: 4.72 Mil/uL (ref 3.87–5.11)
RDW: 13.6 % (ref 11.5–15.5)
WBC: 5.2 10*3/uL (ref 4.0–10.5)

## 2024-03-25 LAB — LIPID PANEL
Cholesterol: 165 mg/dL (ref 0–200)
HDL: 55 mg/dL (ref >39.00)
LDL Cholesterol: 87 mg/dL (ref 0–99)
NonHDL: 109.63
Total CHOL/HDL Ratio: 3
Triglycerides: 112 mg/dL (ref 0.0–149.0)
VLDL: 22.4 mg/dL (ref 0.0–40.0)

## 2024-03-25 LAB — BASIC METABOLIC PANEL WITH GFR
BUN: 15 mg/dL (ref 6–23)
CO2: 30 meq/L (ref 19–32)
Calcium: 9.8 mg/dL (ref 8.4–10.5)
Chloride: 102 meq/L (ref 96–112)
Creatinine, Ser: 0.84 mg/dL (ref 0.40–1.20)
GFR: 79.29 mL/min (ref >60.00)
Glucose, Bld: 96 mg/dL (ref 70–99)
Potassium: 4.1 meq/L (ref 3.5–5.1)
Sodium: 140 meq/L (ref 135–145)

## 2024-03-25 LAB — TSH: TSH: 1.55 u[IU]/mL (ref 0.35–5.50)

## 2024-03-25 LAB — MICROALBUMIN / CREATININE URINE RATIO
Creatinine,U: 138.8 mg/dL
Microalb Creat Ratio: 6.8 mg/g (ref 0.0–30.0)
Microalb, Ur: 0.9 mg/dL (ref 0.0–1.9)

## 2024-03-25 LAB — HEMOGLOBIN A1C: Hgb A1c MFr Bld: 5.5 % (ref 4.6–6.5)

## 2024-03-25 MED ORDER — LINACLOTIDE 72 MCG PO CAPS: 72.0000 ug | ORAL_CAPSULE | Freq: Every day | ORAL | 3 refills | Status: DC

## 2024-03-25 NOTE — Assessment & Plan Note (Signed)
 Chronic problem.  Currently on Mounjaro 10mg  weekly.  Due for eye exam- pt to schedule.  Foot exam done today, microalbumin ordered.  Currently asymptomatic.  Check labs.  Adjust meds prn

## 2024-03-25 NOTE — Patient Instructions (Addendum)
 Schedule your complete physical in 6 months We'll notify you of your lab results and make any changes if needed START the Linzess daily to get a regular rhythm Keep up the good work on healthy diet and regular exercise- you look AMAZING!! Call with any questions or concerns Stay Safe!  Stay Healthy! Happy Spring!!!

## 2024-03-25 NOTE — Assessment & Plan Note (Signed)
 Deteriorated.  Since starting [facility], she has really struggled w/ constipation.  Now that she is up to 10mg  weekly, she is only having BM's q14 days.  She is already taking Miralax daily and using a probiotic.  Will start low dose Linzess regularly to try and get a regular rhythm.  Reviewed supportive care and red flags that should prompt return.  Pt expressed understanding and is in agreement w/ plan.

## 2024-03-25 NOTE — Assessment & Plan Note (Signed)
 Resolved!  Pt is down 47 lbs since August when she started [facility].  She has increased her walking, trying to eat better.  Applauded her efforts.  Will continue to follow.

## 2024-03-25 NOTE — Progress Notes (Signed)
   Subjective:    Patient ID: Monique Gross, female    DOB: [birth year], 53 y.o.   MRN: [id number]  HPI IBS- sxs started in December.  Now only going once every 2 weeks.  Has increased water intake, started a probiotic, added Miralax daily x2 months.  Denies abd cramping.  Reports when she does have a BM it is not painful or difficult.  Occasional bloating.  Currently on Mounjaro.    Diabetes- chronic problem, on Mounjaro 10mg  weekly.  Due for eye exam.  Due for foot exam and microalbumin.  No CP, SOB, HA's, visual changes.  No symptomatic lows.  No numbness/tingling of hands/feet.  Obesity- pt is down 47 lbs since last visit in August.  Currently on [place].  Has increased her daily walking.   Review of Systems For ROS see HPI     Objective:   Physical Exam Vitals reviewed.  Constitutional:      General: She is not in acute distress.    Appearance: She is well-developed. She is not ill-appearing.  HENT:     Head: Normocephalic and atraumatic.  Eyes:     Conjunctiva/sclera: Conjunctivae normal.     Pupils: Pupils are equal, round, and reactive to light.  Neck:     Thyroid: No thyromegaly.  Cardiovascular:     Rate and Rhythm: Normal rate and regular rhythm.     Heart sounds: Normal heart sounds. No murmur heard. Pulmonary:     Effort: Pulmonary effort is normal. No respiratory distress.     Breath sounds: Normal breath sounds.  Abdominal:     General: There is no distension.     Palpations: Abdomen is soft.     Tenderness: There is no abdominal tenderness.  Musculoskeletal:     Cervical back: Normal range of motion and neck supple.  Lymphadenopathy:     Cervical: No cervical adenopathy.  Skin:    General: Skin is warm and dry.  Neurological:     Mental Status: She is alert and oriented to person, place, and time.  Psychiatric:        Behavior: Behavior normal.           Assessment & Plan:

## 2024-03-26 ENCOUNTER — Encounter: Payer: Self-pay | Admitting: Family Medicine

## 2024-03-30 ENCOUNTER — Other Ambulatory Visit: Payer: Self-pay | Admitting: Family Medicine

## 2024-03-31 ENCOUNTER — Telehealth: Payer: Self-pay | Admitting: Pharmacy Technician

## 2024-03-31 ENCOUNTER — Other Ambulatory Visit (HOSPITAL_COMMUNITY): Payer: Self-pay

## 2024-03-31 NOTE — Telephone Encounter (Signed)
 Pharmacy Patient Advocate Encounter   Received notification from CoverMyMeds that prior authorization for Linzess capsules is required/requested.   Insurance verification completed.   The patient is insured through [facility] .   Per test claim: PA required; PA started via CoverMyMeds. KEY [id number] . Waiting for clinical questions to populate.

## 2024-03-31 NOTE — Telephone Encounter (Signed)
 Pharmacy Patient Advocate Encounter   Received notification from CoverMyMeds that prior authorization for Linzess capsules is required/requested.   Insurance verification completed.   The patient is insured through [facility] .   Per test claim: PA required; PA submitted to above mentioned insurance via CoverMyMeds Key/confirmation #/EOC [id number] Status is pending

## 2024-04-01 ENCOUNTER — Telehealth: Payer: Self-pay

## 2024-04-01 ENCOUNTER — Other Ambulatory Visit (HOSPITAL_COMMUNITY): Payer: Self-pay

## 2024-04-01 NOTE — Telephone Encounter (Signed)
 PA denied for linzess, Do you wish to change to a different therapy?

## 2024-04-01 NOTE — Telephone Encounter (Signed)
 Pharmacy Patient Advocate Encounter   Received notification from CoverMyMeds that prior authorization for Mounjaro 2.5MG /0.5ML auto-injectors is due for renewal.   Insurance verification completed.   The patient is insured through [facility].  Action: PA required; PA started via CoverMyMeds. KEY [id number] . Waiting for clinical questions to populate.

## 2024-04-01 NOTE — Telephone Encounter (Signed)
 Clinical questions have been answered and PA submitted. PA currently Pending.

## 2024-04-01 NOTE — Telephone Encounter (Signed)
 Copied from CRM [id number]. Topic: Referral - Prior Authorization Question >> Apr 01, 2024 12:53 PM Pascal Lux wrote: Reason for CRM: Birdena Jubilee. from [facility] called to advised that prior authorization for Linzess capsules was denied. Provided fax number and will fax denial letter to clinic [phone] opt [phone]. opt

## 2024-04-01 NOTE — Telephone Encounter (Signed)
 Was there any information provided other than 'does not meet definition of medical necessity'?  She has chronic constipation not relieved w/ Miralax, fiber, stool softeners, etc

## 2024-04-01 NOTE — Telephone Encounter (Signed)
 Pharmacy Patient Advocate Encounter  Received notification from [facility] that Prior Authorization for Monique Gross has been DENIED.  Full denial letter will be uploaded to the media tab. See denial reason below.  PA #/Case ID/Reference #: [id number]

## 2024-04-02 ENCOUNTER — Other Ambulatory Visit (HOSPITAL_COMMUNITY): Payer: Self-pay

## 2024-04-02 NOTE — Telephone Encounter (Signed)
 She has to have tried and failed the formulary alternative Trulance.

## 2024-04-02 NOTE — Telephone Encounter (Signed)
 Per response patient has to have tried and failed the formulary alternative Trulance.

## 2024-04-02 NOTE — Telephone Encounter (Signed)
 Pharmacy Patient Advocate Encounter  Received notification from [facility] that Prior Authorization for [facility] 2.5MG /0.5ML auto-injectors has been APPROVED from 04/01/24 to 04/01/25   PA #/Case ID/Reference #: [id number]

## 2024-04-03 ENCOUNTER — Other Ambulatory Visit (HOSPITAL_COMMUNITY): Payer: Self-pay

## 2024-04-03 ENCOUNTER — Telehealth: Payer: Self-pay

## 2024-04-03 MED ORDER — TRULANCE 3 MG PO TABS: 1.0000 | ORAL_TABLET | Freq: Every day | ORAL | 3 refills | Status: DC

## 2024-04-03 NOTE — Telephone Encounter (Signed)
 Pharmacy Patient Advocate Encounter  Received notification from [facility] that Prior Authorization for Trulance 3MG  tablets has been APPROVED from 04/03/24 to 04/03/25. Ran test claim, Copay is $25. This test claim was processed through [facility]- copay amounts may vary at other pharmacies due to pharmacy/plan contracts, or as the patient moves through the different stages of their insurance plan.   PA #/Case ID/Reference #: [id number]

## 2024-04-03 NOTE — Telephone Encounter (Signed)
 Since insurance requires her to try Trulance prior to Linzess, prescription was sent to pharmacy

## 2024-04-03 NOTE — Telephone Encounter (Signed)
 Pharmacy Patient Advocate Encounter   Received notification from CoverMyMeds that prior authorization for Trulance 3MG  tablets is required/requested.   Insurance verification completed.   The patient is insured through [facility] .   Per test claim: PA required; PA submitted to above mentioned insurance via CoverMyMeds Key/confirmation #/EOC [id number] Status is pending

## 2024-04-03 NOTE — Telephone Encounter (Signed)
 Called patient to let her know trulance has been sent to her pharmacy, Left VM to return call or she can message in via [place] message.

## 2024-04-06 NOTE — Telephone Encounter (Signed)
 LM to call back phone went directly to VM

## 2024-04-20 ENCOUNTER — Telehealth: Payer: Self-pay | Admitting: Pharmacy Technician

## 2024-04-20 ENCOUNTER — Other Ambulatory Visit (HOSPITAL_COMMUNITY): Payer: Self-pay

## 2024-04-20 NOTE — Telephone Encounter (Signed)
 Pharmacy Patient Advocate Encounter   Received notification from CoverMyMeds that prior authorization for Trulance  3MG  tablets is required/requested.   Insurance verification completed.   The patient is insured through [facility] .   Per test claim: The current 30 day co-pay is, $25.00.  No PA needed at this time. This test claim was processed through [facility]- copay amounts may vary at other pharmacies due to pharmacy/plan contracts, or as the patient moves through the different stages of their insurance plan.    Achieved CMM Key# [id number]

## 2024-05-01 ENCOUNTER — Telehealth: Payer: Self-pay

## 2024-05-01 ENCOUNTER — Other Ambulatory Visit (HOSPITAL_COMMUNITY): Payer: Self-pay

## 2024-05-01 NOTE — Telephone Encounter (Signed)
 Pharmacy Patient Advocate Encounter   Received notification from [facility] that prior authorization for Mounjaro  2.5 is required/requested.   Insurance verification completed.   The patient is insured through [facility] .   Per test claim: The current 84 day co-pay is, $25.00.  No PA needed at this time. This test claim was processed through [facility]- copay amounts may vary at other pharmacies due to pharmacy/plan contracts, or as the patient moves through the different stages of their insurance plan.

## 2024-05-03 ENCOUNTER — Other Ambulatory Visit: Payer: Self-pay | Admitting: Family Medicine

## 2024-05-09 ENCOUNTER — Other Ambulatory Visit: Payer: Self-pay | Admitting: Family Medicine

## 2024-05-09 DIAGNOSIS — E038 Other specified hypothyroidism: Secondary | ICD-10-CM

## 2024-06-25 ENCOUNTER — Other Ambulatory Visit: Payer: Self-pay | Admitting: Family Medicine

## 2024-06-26 ENCOUNTER — Other Ambulatory Visit: Payer: Self-pay | Admitting: Family Medicine

## 2024-07-26 ENCOUNTER — Other Ambulatory Visit: Payer: Self-pay | Admitting: Family Medicine

## 2024-08-05 ENCOUNTER — Ambulatory Visit: Admitting: Family Medicine

## 2024-08-11 ENCOUNTER — Telehealth: Payer: Self-pay

## 2024-08-11 ENCOUNTER — Other Ambulatory Visit (HOSPITAL_COMMUNITY): Payer: Self-pay

## 2024-08-11 ENCOUNTER — Ambulatory Visit: Admitting: Family Medicine

## 2024-08-11 VITALS — BP 118/78 | HR 77 | Temp 98.0°F | Ht 66.0 in | Wt 217.4 lb

## 2024-08-11 DIAGNOSIS — K581 Irritable bowel syndrome with constipation: Secondary | ICD-10-CM | POA: Diagnosis not present

## 2024-08-11 MED ORDER — LUBIPROSTONE 8 MCG PO CAPS: 8.0000 ug | ORAL_CAPSULE | Freq: Two times a day (BID) | ORAL | 3 refills | Status: DC

## 2024-08-11 MED ORDER — HYDROCODONE-ACETAMINOPHEN 5-325 MG PO TABS: 1.0000 | ORAL_TABLET | Freq: Four times a day (QID) | ORAL | 0 refills | Status: DC | PRN

## 2024-08-11 NOTE — Progress Notes (Signed)
   Subjective:    Patient ID: Monique Gross, female    DOB: [birth year], 53 y.o.   MRN: [id number]  HPI Constipation- sxs started this spring.  Insurance denied Linzess .  Pt reports sxs have significantly worsened over the past 2 months.  Can go 3-4 weeks w/o BM.  Will have severe back pain and nausea and vomiting prior to eventually having diarrhea.  Last BM was Friday- but it was incomplete.  Drinks a lot of water, walking regularly.  Doing Miralax daily.  Will occasionally take Dulcolax x4 w/o relief.  + bloating.   Review of Systems For ROS see HPI     Objective:   Physical Exam Vitals reviewed.  Constitutional:      General: She is not in acute distress.    Appearance: Normal appearance. She is not ill-appearing.  HENT:     Head: Normocephalic and atraumatic.  Cardiovascular:     Rate and Rhythm: Normal rate and regular rhythm.  Pulmonary:     Effort: Pulmonary effort is normal. No respiratory distress.  Abdominal:     General: There is distension (mild).     Palpations: Abdomen is soft.     Tenderness: There is no abdominal tenderness. There is no guarding or rebound.  Skin:    General: Skin is warm and dry.  Neurological:     General: No focal deficit present.     Mental Status: She is alert and oriented to person, place, and time.  Psychiatric:        Mood and Affect: Mood normal.        Behavior: Behavior normal.        Thought Content: Thought content normal.           Assessment & Plan:

## 2024-08-11 NOTE — Assessment & Plan Note (Signed)
 Deteriorated.  Pt now going 3-4 weeks between BM's.  When she does finally have one, she develops severe LBP, N/V, and then diarrhea.  Will continue Miralax and add Milk of Magnesia.  Will also try and get Amitiza  approved as Linzess  was not covered and pt never heard about Trulance .  If no improvement, will need GI referral and possibly cleanout.  Pt expressed understanding and is in agreement w/ plan.

## 2024-08-11 NOTE — Telephone Encounter (Signed)
 Pharmacy Patient Advocate Encounter   Received notification from CoverMyMeds that prior authorization for HYDROcodone -Acetaminophen  5-325MG  tablets is required/requested.   Insurance verification completed.   The patient is insured through [facility] .   Per test claim: PA required; PA submitted to above mentioned insurance via Latent Key/confirmation #/EOC [id number] Status is pending

## 2024-08-11 NOTE — Patient Instructions (Addendum)
 Follow up as needed or as scheduled START the Amitiza  twice daily to help w/ constipation ADD Milk of Magnesia to help w/ constipation until we get the Amitiza  approved Continue to drink LOTS of water, take the Miralax, and Dulcolax as needed Call with any questions or concerns Hang in there!!!

## 2024-08-11 NOTE — Telephone Encounter (Signed)
 Sent for PA

## 2024-08-12 ENCOUNTER — Telehealth: Payer: Self-pay

## 2024-08-12 MED ORDER — HYDROCODONE-ACETAMINOPHEN 5-325 MG PO TABS: 1.0000 | ORAL_TABLET | Freq: Four times a day (QID) | ORAL | 0 refills | Status: AC | PRN

## 2024-08-12 NOTE — Telephone Encounter (Signed)
 M51.26 is the dx.  Thank you.

## 2024-08-12 NOTE — Telephone Encounter (Signed)
 Pharmacy is needing a diagnosis for hydrocodone -acetaminophen  and they can not fill a 60 day supply only 5-7 day supply.

## 2024-08-12 NOTE — Telephone Encounter (Signed)
 Pharmacy is needing a new rx with the dx code attached to it. They will not take a verbal over the phone.

## 2024-08-12 NOTE — Telephone Encounter (Signed)
 Copied from CRM #[id number]. Topic: Clinical - Medication Question >> Aug 12, 2024  1:33 PM Franky GRADE wrote: Reason for CRM: [facility] pharmacy is calling because they received a prescription for HYDROcodone -acetaminophen  (NORCO/VICODIN) 5-325 MG tablet [[id number]], they need a diagnosis and per pharmacist stated they will not be able to provide the full 60 day supply just 5-7 day supply.

## 2024-08-12 NOTE — Telephone Encounter (Signed)
 New prescription sent

## 2024-08-12 NOTE — Telephone Encounter (Signed)
 Pharmacy Patient Advocate Encounter  Received notification from [facility] that Prior Authorization for HYDROcodone -Acetaminophen  5-325MG  tablets  has been APPROVED from 08/12/2024 to 09/10/2024   PA #/Case ID/Reference #: [id number]

## 2024-08-14 ENCOUNTER — Other Ambulatory Visit (HOSPITAL_COMMUNITY): Payer: Self-pay

## 2024-08-14 ENCOUNTER — Encounter: Payer: Self-pay | Admitting: Family Medicine

## 2024-08-14 ENCOUNTER — Telehealth: Payer: Self-pay

## 2024-08-14 NOTE — Telephone Encounter (Signed)
 I reviewed patients chart and I cannot find it PA has been started for Amitiza ? Can we submit one for patient please? Attaching provider for updates.

## 2024-08-14 NOTE — Telephone Encounter (Signed)
 Pharmacy Patient Advocate Encounter   Received notification from Patient Advice Request messages that prior authorization for Lubiprostone  capsules is required/requested.   Insurance verification completed.   The patient is insured through [facility] .   Per test claim: PA required; PA submitted to above mentioned insurance via Latent Key/confirmation #/EOC [facility] Status is pending

## 2024-08-17 ENCOUNTER — Other Ambulatory Visit: Payer: Self-pay

## 2024-08-17 ENCOUNTER — Other Ambulatory Visit (HOSPITAL_COMMUNITY): Payer: Self-pay

## 2024-08-17 ENCOUNTER — Telehealth: Payer: Self-pay

## 2024-08-17 MED ORDER — OMEPRAZOLE 40 MG PO CPDR: 40.0000 mg | DELAYED_RELEASE_CAPSULE | Freq: Every day | ORAL | 1 refills | Status: AC

## 2024-08-17 NOTE — Telephone Encounter (Signed)
 Called patient to verbalize medication approval and patient verbalized understanding.

## 2024-08-17 NOTE — Telephone Encounter (Signed)
 Rx for Lubiprostone  has been denied, please advise next actions

## 2024-08-17 NOTE — Telephone Encounter (Signed)
 Trulance  was prescribed earlier this year for pt but she never got any response regarding the prior authorization so she never picked up the medication.  Is this something that can be done again for her?

## 2024-08-17 NOTE — Telephone Encounter (Signed)
 Trulance  was approved. Please see telephone encounter on 04/03/24.

## 2024-08-17 NOTE — Telephone Encounter (Signed)
 Per pt she is taking Omeprazole  OTC now and this is not working. I have added PCP in message to see if she would like to continue with the PA.

## 2024-08-17 NOTE — Telephone Encounter (Signed)
 Called lvm for pt to call office to call office Please ask pt at time of call

## 2024-08-17 NOTE — Telephone Encounter (Signed)
 Pt has been notified.

## 2024-08-17 NOTE — Telephone Encounter (Signed)
Rx has been sent in. 

## 2024-08-17 NOTE — Telephone Encounter (Signed)
 Rather than do a PA for Omeprazole  20mg  (OTC) strength that is not effective for her, we can send new prescription for Omeprazole  40mg  daily (#90, 1 refill)

## 2024-08-17 NOTE — Telephone Encounter (Signed)
 Pharmacy Patient Advocate Encounter  Received notification from [facility] that Prior Authorization for Lubiprostone  capsules  has been DENIED.  Full denial letter will be uploaded to the media tab. See denial reason below.   PA #/Case ID/Reference #: [id number]

## 2024-08-17 NOTE — Telephone Encounter (Signed)
**Note De-identified  [place] Obfuscation** Please advise 

## 2024-08-17 NOTE — Telephone Encounter (Signed)
 Since no meds have been started, she needs to wait and see if they make a difference.  Please let pt know that it seems like Trulance  was approved- NOT the Amitiza  (Trulance  was what I tried to get approved in April but was never started)

## 2024-08-18 ENCOUNTER — Encounter: Payer: Self-pay | Admitting: Family Medicine

## 2024-08-18 ENCOUNTER — Other Ambulatory Visit (HOSPITAL_COMMUNITY): Payer: Self-pay

## 2024-08-18 MED ORDER — PANTOPRAZOLE SODIUM 40 MG PO TBEC: 40.0000 mg | DELAYED_RELEASE_TABLET | Freq: Every day | ORAL | 1 refills | Status: AC

## 2024-08-18 NOTE — Telephone Encounter (Signed)
 Please switch pt to Pantoprazole  40mg  daily, #90, 1 refill

## 2024-08-18 NOTE — Telephone Encounter (Signed)
 Looks like Trulance  is approved however I do not see where this has been sent in please advise.

## 2024-08-18 NOTE — Telephone Encounter (Addendum)
 Pantoprazole  40mg  sent to pharmacy.   Called patient to let her know the change in her current medication, Left Vm to call back with any questions or concerns.

## 2024-08-18 NOTE — Addendum Note (Signed)
 Addended by: RANDINE BERYL SAUNDERS on: 08/18/2024 09:12 AM   Modules accepted: Orders

## 2024-08-19 ENCOUNTER — Other Ambulatory Visit: Payer: Self-pay | Admitting: Family Medicine

## 2024-08-19 DIAGNOSIS — E038 Other specified hypothyroidism: Secondary | ICD-10-CM

## 2024-08-19 MED ORDER — TRULANCE 3 MG PO TABS: 1.0000 | ORAL_TABLET | Freq: Every day | ORAL | 3 refills | Status: AC

## 2024-08-26 ENCOUNTER — Encounter: Payer: Self-pay | Admitting: Family Medicine

## 2024-08-26 ENCOUNTER — Encounter

## 2024-08-26 NOTE — Telephone Encounter (Signed)
 Patient is not feeling well and would like for a medication to be called in? Would you like a phone visit or patient to come in?

## 2024-08-27 ENCOUNTER — Ambulatory Visit: Admitting: Family Medicine

## 2024-09-18 ENCOUNTER — Other Ambulatory Visit: Payer: Self-pay | Admitting: Family Medicine

## 2024-10-07 ENCOUNTER — Other Ambulatory Visit: Payer: Self-pay | Admitting: Family Medicine

## 2024-10-20 ENCOUNTER — Other Ambulatory Visit: Payer: Self-pay | Admitting: Family Medicine

## 2024-11-26 ENCOUNTER — Ambulatory Visit: Admitting: Family Medicine

## 2024-11-26 ENCOUNTER — Ambulatory Visit: Payer: Self-pay | Admitting: Family Medicine

## 2024-11-26 VITALS — BP 124/70 | HR 78 | Temp 98.3°F | Ht 66.0 in | Wt 228.4 lb

## 2024-11-26 DIAGNOSIS — Z7985 Long-term (current) use of injectable non-insulin antidiabetic drugs: Secondary | ICD-10-CM | POA: Diagnosis not present

## 2024-11-26 DIAGNOSIS — E038 Other specified hypothyroidism: Secondary | ICD-10-CM | POA: Diagnosis not present

## 2024-11-26 DIAGNOSIS — E119 Type 2 diabetes mellitus without complications: Secondary | ICD-10-CM

## 2024-11-26 LAB — HEPATIC FUNCTION PANEL
ALT: 17 U/L (ref 0–35)
AST: 20 U/L (ref 0–37)
Albumin: 4.6 g/dL (ref 3.5–5.2)
Alkaline Phosphatase: 74 U/L (ref 39–117)
Bilirubin, Direct: 0.1 mg/dL (ref 0.0–0.3)
Total Bilirubin: 0.3 mg/dL (ref 0.2–1.2)
Total Protein: 7.4 g/dL (ref 6.0–8.3)

## 2024-11-26 LAB — BASIC METABOLIC PANEL WITH GFR
BUN: 13 mg/dL (ref 6–23)
CO2: 32 meq/L (ref 19–32)
Calcium: 9.8 mg/dL (ref 8.4–10.5)
Chloride: 99 meq/L (ref 96–112)
Creatinine, Ser: 0.91 mg/dL (ref 0.40–1.20)
GFR: 71.69 mL/min (ref >60.00)
Glucose, Bld: 79 mg/dL (ref 70–99)
Potassium: 4.2 meq/L (ref 3.5–5.1)
Sodium: 140 meq/L (ref 135–145)

## 2024-11-26 LAB — LIPID PANEL
Cholesterol: 191 mg/dL (ref 0–200)
HDL: 68.9 mg/dL (ref >39.00)
LDL Cholesterol: 84 mg/dL (ref 0–99)
NonHDL: 122.38
Total CHOL/HDL Ratio: 3
Triglycerides: 190 mg/dL — ABNORMAL HIGH (ref 0.0–149.0)
VLDL: 38 mg/dL (ref 0.0–40.0)

## 2024-11-26 LAB — CBC WITH DIFFERENTIAL/PLATELET
Basophils Absolute: 0.1 K/uL (ref 0.0–0.1)
Basophils Relative: 0.9 % (ref 0.0–3.0)
Eosinophils Absolute: 0.1 K/uL (ref 0.0–0.7)
Eosinophils Relative: 2.1 % (ref 0.0–5.0)
HCT: 43.7 % (ref 36.0–46.0)
Hemoglobin: 14.5 g/dL (ref 12.0–15.0)
Lymphocytes Relative: 26.9 % (ref 12.0–46.0)
Lymphs Abs: 1.7 K/uL (ref 0.7–4.0)
MCHC: 33.1 g/dL (ref 30.0–36.0)
MCV: 87.8 fl (ref 78.0–100.0)
Monocytes Absolute: 0.5 K/uL (ref 0.1–1.0)
Monocytes Relative: 7.5 % (ref 3.0–12.0)
Neutro Abs: 4.1 K/uL (ref 1.4–7.7)
Neutrophils Relative %: 62.6 % (ref 43.0–77.0)
Platelets: 231 K/uL (ref 150.0–400.0)
RBC: 4.98 Mil/uL (ref 3.87–5.11)
RDW: 13.3 % (ref 11.5–15.5)
WBC: 6.5 K/uL (ref 4.0–10.5)

## 2024-11-26 LAB — TSH: TSH: 4.61 u[IU]/mL (ref 0.35–5.50)

## 2024-11-26 LAB — HEMOGLOBIN A1C: Hgb A1c MFr Bld: 5.4 % (ref 4.6–6.5)

## 2024-11-26 NOTE — Progress Notes (Unsigned)
   Subjective:    Patient ID: Monique Gross, female    DOB: Mar 18, 1971, 53 y.o.   MRN: [id number]  HPI DM- chronic problem, on Mounjaro  10mg  weekly.  UTD on eye exam, microalbumin.  Due for foot exam.  No CP, SOB, HA's, visual changes, abd pain, N/V.  No numbness/tingling of hands/feet.  Hypothyroid- chronic problem, on Levothyroxine  125mcg daily.  No changes to skin/hair/nails  Obesity- ongoing issue.  Has gained 11 lbs since August.  BMI now 36.86  Pt reports increased hunger in the last month.   Review of Systems For ROS see HPI     Objective:   Physical Exam Vitals reviewed.  Constitutional:      General: She is not in acute distress.    Appearance: Normal appearance. She is well-developed. She is obese. She is not ill-appearing.  HENT:     Head: Normocephalic and atraumatic.  Eyes:     Conjunctiva/sclera: Conjunctivae normal.     Pupils: Pupils are equal, round, and reactive to light.  Neck:     Thyroid : No thyromegaly.  Cardiovascular:     Rate and Rhythm: Normal rate and regular rhythm.     Pulses: Normal pulses.     Heart sounds: Normal heart sounds. No murmur heard. Pulmonary:     Effort: Pulmonary effort is normal. No respiratory distress.     Breath sounds: Normal breath sounds.  Abdominal:     General: There is no distension.     Palpations: Abdomen is soft.     Tenderness: There is no abdominal tenderness.  Musculoskeletal:     Cervical back: Normal range of motion and neck supple.     Right lower leg: No edema.     Left lower leg: No edema.  Lymphadenopathy:     Cervical: No cervical adenopathy.  Skin:    General: Skin is warm and dry.  Neurological:     General: No focal deficit present.     Mental Status: She is alert and oriented to person, place, and time.  Psychiatric:        Mood and Affect: Mood normal.        Behavior: Behavior normal.        Thought Content: Thought content normal.           Assessment & Plan:

## 2024-11-26 NOTE — Patient Instructions (Signed)
 Schedule your complete physical in 6 months We'll notify you of your lab results and make any changes if needed Continue to work on healthy diet and regular exercise- you can do it! Schedule your mammogram at your convenience Call with any questions or concerns Stay Safe! Stay Healthy! Happy Holidays!!!

## 2024-11-29 NOTE — Assessment & Plan Note (Signed)
 Chronic problem.  On Levothyroxine  125mcg daily and currently asymptomatic.  Check labs.  Adjust meds prn

## 2024-11-29 NOTE — Assessment & Plan Note (Signed)
 Chronic problem.  On Mounjaro  w/o difficulty.  Foot exam done today.  UTD on eye exam, microalbumin.  Currently asymptomatic.  Check labs.  Adjust meds prn

## 2024-11-29 NOTE — Assessment & Plan Note (Signed)
 Deteriorated.  She has gained 11 lbs since August and BMI is now 36.86 (with DM).  Encouraged low carb diet and regular exercise.  Will continue to follow.

## 2024-12-02 ENCOUNTER — Other Ambulatory Visit: Payer: Self-pay | Admitting: Family Medicine

## 2024-12-02 DIAGNOSIS — E038 Other specified hypothyroidism: Secondary | ICD-10-CM

## 2024-12-20 ENCOUNTER — Other Ambulatory Visit: Payer: Self-pay | Admitting: Family Medicine

## 2025-01-03 ENCOUNTER — Other Ambulatory Visit: Payer: Self-pay | Admitting: Family Medicine

## 2025-01-11 ENCOUNTER — Other Ambulatory Visit: Payer: Self-pay | Admitting: Family Medicine

## 2025-05-27 ENCOUNTER — Encounter: Admitting: Family Medicine
